# Patient Record
Sex: Female | Born: 1970 | Race: White | Hispanic: No | Marital: Married | Smoking: Never smoker
Health system: Southern US, Community
[De-identification: ages and names within clinical notes are randomized; demographics above are authoritative.]

## PROBLEM LIST (undated history)

## (undated) DIAGNOSIS — K219 Gastro-esophageal reflux disease without esophagitis: Secondary | ICD-10-CM

## (undated) DIAGNOSIS — F419 Anxiety disorder, unspecified: Secondary | ICD-10-CM

## (undated) DIAGNOSIS — G43109 Migraine with aura, not intractable, without status migrainosus: Secondary | ICD-10-CM

## (undated) HISTORY — DX: Gastro-esophageal reflux disease without esophagitis: K21.9

## (undated) HISTORY — DX: Anxiety disorder, unspecified: F41.9

## (undated) HISTORY — DX: Migraine with aura, not intractable, without status migrainosus: G43.109

---

## 2014-05-06 ENCOUNTER — Encounter: Payer: Self-pay | Admitting: Nurse Practitioner

## 2014-05-06 ENCOUNTER — Ambulatory Visit (INDEPENDENT_AMBULATORY_CARE_PROVIDER_SITE_OTHER): Payer: BC Managed Care – PPO | Admitting: Nurse Practitioner

## 2014-05-06 VITALS — BP 102/71 | HR 83 | Temp 98.2°F | Resp 18 | Ht 65.75 in | Wt 138.0 lb

## 2014-05-06 DIAGNOSIS — K219 Gastro-esophageal reflux disease without esophagitis: Secondary | ICD-10-CM

## 2014-05-06 DIAGNOSIS — Z0000 Encounter for general adult medical examination without abnormal findings: Secondary | ICD-10-CM

## 2014-05-06 DIAGNOSIS — Z23 Encounter for immunization: Secondary | ICD-10-CM

## 2014-05-06 DIAGNOSIS — F411 Generalized anxiety disorder: Secondary | ICD-10-CM

## 2014-05-06 NOTE — Patient Instructions (Signed)
Develop lifelong habits of exercise most days of the week: take a 30 minute walk. The benefits include weight loss, lower risk for heart disease, diabetes, stroke, high blood pressure, lower rates of depression & dementia, better sleep quality & bone health.  Start sinus rinses (Neilmed sinus rinse) & listerene gargles daily to clear up tonsils.   Start probiotic daily Align or Culterelle. Some studies show best outcomes with both types-take 1 capsule daily, but alternate brand each day.   Tonsillitis Tonsillitis is an infection of the throat that causes the tonsils to become red, tender, and swollen. Tonsils are collections of lymphoid tissue at the back of the throat. Each tonsil has crevices (crypts). Tonsils help fight nose and throat infections and keep infection from spreading to other parts of the body for the first 18 months of life.  CAUSES Sudden (acute) tonsillitis is usually caused by infection with streptococcal bacteria. Long-lasting (chronic) tonsillitis occurs when the crypts of the tonsils become filled with pieces of food and bacteria, which makes it easy for the tonsils to become repeatedly infected. SYMPTOMS  Symptoms of tonsillitis include:  A sore throat, with possible difficulty swallowing.  White patches on the tonsils.  Fever.  Tiredness.  New episodes of snoring during sleep, when you did not snore before.  Small, foul-smelling, yellowish-white pieces of material (tonsilloliths) that you occasionally cough up or spit out. The tonsilloliths can also cause you to have bad breath. DIAGNOSIS Tonsillitis can be diagnosed through a physical exam. Diagnosis can be confirmed with the results of lab tests, including a throat culture. TREATMENT  The goals of tonsillitis treatment include the reduction of the severity and duration of symptoms and prevention of associated conditions. Symptoms of tonsillitis can be improved with the use of steroids to reduce the swelling.  Tonsillitis caused by bacteria can be treated with antibiotic medicines. Usually, treatment with antibiotic medicines is started before the cause of the tonsillitis is known. However, if it is determined that the cause is not bacterial, antibiotic medicines will not treat the tonsillitis. If attacks of tonsillitis are severe and frequent, your health care provider may recommend surgery to remove the tonsils (tonsillectomy). HOME CARE INSTRUCTIONS   Rest as much as possible and get plenty of sleep.  Drink plenty of fluids. While the throat is very sore, eat soft foods or liquids, such as sherbet, soups, or instant breakfast drinks.  Eat frozen ice pops.  Gargle with a warm or cold liquid to help soothe the throat. Mix 1/4 teaspoon of salt and 1/4 teaspoon of baking soda in 8 oz of water. SEEK MEDICAL CARE IF:   Large, tender lumps develop in your neck.  A rash develops.  A green, yellow-brown, or bloody substance is coughed up.  You are unable to swallow liquids or food for 24 hours.  You notice that only one of the tonsils is swollen. SEEK IMMEDIATE MEDICAL CARE IF:   You develop any new symptoms such as vomiting, severe headache, stiff neck, chest pain, or trouble breathing or swallowing.  You have severe throat pain along with drooling or voice changes.  You have severe pain, unrelieved with recommended medications.  You are unable to fully open the mouth.  You develop redness, swelling, or severe pain anywhere in the neck.  You have a fever. MAKE SURE YOU:   Understand these instructions.  Will watch your condition.  Will get help right away if you are not doing well or get worse. Document Released: 05/11/2005 Document  Revised: 12/16/2013 Document Reviewed: 01/18/2013 [facility] Patient Information 2015 [place], [place]. This information is not intended to replace advice given to you by your health care provider. Make sure you discuss any questions you have with your health  care provider.   Preventive Care for Adults, Female A healthy lifestyle and preventive care can promote health and wellness. Preventive health guidelines for women include the following key practices.  A routine yearly physical is a good way to check with your caregiver about your health and preventive screening. It is a chance to share any concerns and updates on your health, and to receive a thorough exam.  Visit your dentist for a routine exam and preventive care every 6 months. Brush your teeth twice a day and floss once a day. Good oral hygiene prevents tooth decay and gum disease.  The frequency of eye exams is based on your age, health, family medical history, use of contact lenses, and other factors. Follow your caregiver's recommendations for frequency of eye exams.  Eat a healthy diet. Foods like vegetables, fruits, whole grains, low-fat dairy products, and lean protein foods contain the nutrients you need without too many calories. Decrease your intake of foods high in solid fats, added sugars, and salt. Eat the right amount of calories for you.Get information about a proper diet from your caregiver, if necessary.  Regular physical exercise is one of the most important things you can do for your health. Most adults should get at least 150 minutes of moderate-intensity exercise (any activity that increases your heart rate and causes you to sweat) each week. In addition, most adults need muscle-strengthening exercises on 2 or more days a week.  Maintain a healthy weight. The body mass index (BMI) is a screening tool to identify possible weight problems. It provides an estimate of body fat based on height and weight. Your caregiver can help determine your BMI, and can help you achieve or maintain a healthy weight.For adults 20 years and older:  A BMI below 18.5 is considered underweight.  A BMI of 18.5 to 24.9 is normal.  A BMI of 25 to 29.9 is considered overweight.  A BMI of 30  and above is considered obese.  Maintain normal blood lipids and cholesterol levels by exercising and minimizing your intake of saturated fat. Eat a balanced diet with plenty of fruit and vegetables. Blood tests for lipids and cholesterol should begin at age 40 and be repeated every 5 years. If your lipid or cholesterol levels are high, you are over 50, or you are at high risk for heart disease, you may need your cholesterol levels checked more frequently.Ongoing high lipid and cholesterol levels should be treated with medicines if diet and exercise are not effective.  If you smoke, find out from your caregiver how to quit. If you do not use tobacco, do not start.  Lung cancer screening is recommended for adults aged 24 80 years who are at high risk for developing lung cancer because of a history of smoking. Yearly low-dose computed tomography (CT) is recommended for people who have at least a 30-pack-year history of smoking and are a current smoker or have quit within the past 15 years. A pack year of smoking is smoking an average of 1 pack of cigarettes a day for 1 year (for example: 1 pack a day for 30 years or 2 packs a day for 15 years). Yearly screening should continue until the smoker has stopped smoking for at least 15 years. Yearly  screening should also be stopped for people who develop a health problem that would prevent them from having lung cancer treatment.  If you are pregnant, do not drink alcohol. If you are breastfeeding, be very cautious about drinking alcohol. If you are not pregnant and choose to drink alcohol, do not exceed 1 drink per day. One drink is considered to be 12 ounces (355 mL) of beer, 5 ounces (148 mL) of wine, or 1.5 ounces (44 mL) of liquor.  Avoid use of street drugs. Do not share needles with anyone. Ask for help if you need support or instructions about stopping the use of drugs.  High blood pressure causes heart disease and increases the risk of stroke. Your blood  pressure should be checked at least every 1 to 2 years. Ongoing high blood pressure should be treated with medicines if weight loss and exercise are not effective.  If you are 26 to 42 years old, ask your caregiver if you should take aspirin to prevent strokes.  Diabetes screening involves taking a blood sample to check your fasting blood sugar level. This should be done once every 3 years, after age 81, if you are within normal weight and without risk factors for diabetes. Testing should be considered at a younger age or be carried out more frequently if you are overweight and have at least 1 risk factor for diabetes.  Breast cancer screening is essential preventive care for women. You should practice "breast self-awareness." This means understanding the normal appearance and feel of your breasts and may include breast self-examination. Any changes detected, no matter how small, should be reported to a caregiver. Women in their 66s and 30s should have a clinical breast exam (CBE) by a caregiver as part of a regular health exam every 1 to 3 years. After age 52, women should have a CBE every year. Starting at age 39, women should consider having a mammography (breast X-ray test) every year. Women who have a family history of breast cancer should talk to their caregiver about genetic screening. Women at a high risk of breast cancer should talk to their caregivers about having magnetic resonance imaging (MRI) and a mammography every year.  Breast cancer gene (BRCA)-related cancer risk assessment is recommended for women who have family members with BRCA-related cancers. BRCA-related cancers include breast, ovarian, tubal, and peritoneal cancers. Having family members with these cancers may be associated with an increased risk for harmful changes (mutations) in the breast cancer genes BRCA1 and BRCA2. Results of the assessment will determine the need for genetic counseling and BRCA1 and BRCA2 testing.  The Pap  test is a screening test for cervical cancer. A Pap test can show cell changes on the cervix that might become cervical cancer if left untreated. A Pap test is a procedure in which cells are obtained and examined from the lower end of the uterus (cervix).  Women should have a Pap test starting at age 14.  Between ages 47 and 74, Pap tests should be repeated every 2 years.  Beginning at age 78, you should have a Pap test every 3 years as long as the past 3 Pap tests have been normal.  Some women have medical problems that increase the chance of getting cervical cancer. Talk to your caregiver about these problems. It is especially important to talk to your caregiver if a new problem develops soon after your last Pap test. In these cases, your caregiver may recommend more frequent screening and Pap tests.  The above recommendations are the same for women who have or have not gotten the vaccine for human papillomavirus (HPV).  If you had a hysterectomy for a problem that was not cancer or a condition that could lead to cancer, then you no longer need Pap tests. Even if you no longer need a Pap test, a regular exam is a good idea to make sure no other problems are starting.  If you are between ages 56 and 81, and you have had normal Pap tests going back 10 years, you no longer need Pap tests. Even if you no longer need a Pap test, a regular exam is a good idea to make sure no other problems are starting.  If you have had past treatment for cervical cancer or a condition that could lead to cancer, you need Pap tests and screening for cancer for at least 20 years after your treatment.  If Pap tests have been discontinued, risk factors (such as a new sexual partner) need to be reassessed to determine if screening should be resumed.  The HPV test is an additional test that may be used for cervical cancer screening. The HPV test looks for the virus that can cause the cell changes on the cervix. The cells  collected during the Pap test can be tested for HPV. The HPV test could be used to screen women aged 52 years and older, and should be used in women of any age who have unclear Pap test results. After the age of 47, women should have HPV testing at the same frequency as a Pap test.  Colorectal cancer can be detected and often prevented. Most routine colorectal cancer screening begins at the age of 56 and continues through age 57. However, your caregiver may recommend screening at an earlier age if you have risk factors for colon cancer. On a yearly basis, your caregiver may provide home test kits to check for hidden blood in the stool. Use of a small camera at the end of a tube, to directly examine the colon (sigmoidoscopy or colonoscopy), can detect the earliest forms of colorectal cancer. Talk to your caregiver about this at age 65, when routine screening begins. Direct examination of the colon should be repeated every 5 to 10 years through age 74, unless early forms of pre-cancerous polyps or small growths are found.  Hepatitis C blood testing is recommended for all people born from [birth year] through 1964 and any individual with known risks for hepatitis C.  Practice safe sex. Use condoms and avoid high-risk sexual practices to reduce the spread of sexually transmitted infections (STIs). STIs include gonorrhea, chlamydia, syphilis, trichomonas, herpes, HPV, and human immunodeficiency virus (HIV). Herpes, HIV, and HPV are viral illnesses that have no cure. They can result in disability, cancer, and death. Sexually active women aged 9 and younger should be checked for chlamydia. Older women with new or multiple partners should also be tested for chlamydia. Testing for other STIs is recommended if you are sexually active and at increased risk.  Osteoporosis is a disease in which the bones lose minerals and strength with aging. This can result in serious bone fractures. The risk of osteoporosis can be identified  using a bone density scan. Women ages 11 and over and women at risk for fractures or osteoporosis should discuss screening with their caregivers. Ask your caregiver whether you should take a calcium supplement or vitamin D to reduce the rate of osteoporosis.  Menopause can be associated with physical symptoms  and risks. Hormone replacement therapy is available to decrease symptoms and risks. You should talk to your caregiver about whether hormone replacement therapy is right for you.  Use sunscreen. Apply sunscreen liberally and repeatedly throughout the day. You should seek shade when your shadow is shorter than you. Protect yourself by wearing long sleeves, pants, a wide-brimmed hat, and sunglasses year round, whenever you are outdoors.  Once a month, do a whole body skin exam, using a mirror to look at the skin on your back. Notify your caregiver of new moles, moles that have irregular borders, moles that are larger than a pencil eraser, or moles that have changed in shape or color.  Stay current with required immunizations.  Influenza vaccine. All adults should be immunized every year.  Tetanus, diphtheria, and acellular pertussis (Td, Tdap) vaccine. Pregnant women should receive 1 dose of Tdap vaccine during each pregnancy. The dose should be obtained regardless of the length of time since the last dose. Immunization is preferred during the 27th to 36th week of gestation. An adult who has not previously received Tdap or who does not know her vaccine status should receive 1 dose of Tdap. This initial dose should be followed by tetanus and diphtheria toxoids (Td) booster doses every 10 years. Adults with an unknown or incomplete history of completing a 3-dose immunization series with Td-containing vaccines should begin or complete a primary immunization series including a Tdap dose. Adults should receive a Td booster every 10 years.  Varicella vaccine. An adult without evidence of immunity to  varicella should receive 2 doses or a second dose if she has previously received 1 dose. Pregnant females who do not have evidence of immunity should receive the first dose after pregnancy. This first dose should be obtained before leaving the health care facility. The second dose should be obtained 4 8 weeks after the first dose.  Human papillomavirus (HPV) vaccine. Females aged 90 26 years who have not received the vaccine previously should obtain the 3-dose series. The vaccine is not recommended for use in pregnant females. However, pregnancy testing is not needed before receiving a dose. If a female is found to be pregnant after receiving a dose, no treatment is needed. In that case, the remaining doses should be delayed until after the pregnancy. Immunization is recommended for any person with an immunocompromised condition through the age of 28 years if she did not get any or all doses earlier. During the 3-dose series, the second dose should be obtained 4 8 weeks after the first dose. The third dose should be obtained 24 weeks after the first dose and 16 weeks after the second dose.  Zoster vaccine. One dose is recommended for adults aged 28 years or older unless certain conditions are present.  Measles, mumps, and rubella (MMR) vaccine. Adults born before [birth year] generally are considered immune to measles and mumps. Adults born in [birth year] or later should have 1 or more doses of MMR vaccine unless there is a contraindication to the vaccine or there is laboratory evidence of immunity to each of the three diseases. A routine second dose of MMR vaccine should be obtained at least 28 days after the first dose for students attending postsecondary schools, health care workers, or international travelers. People who received inactivated measles vaccine or an unknown type of measles vaccine during 1963 1967 should receive 2 doses of MMR vaccine. People who received inactivated mumps vaccine or an unknown type of  mumps vaccine before 1979 and are  at high risk for mumps infection should consider immunization with 2 doses of MMR vaccine. For females of childbearing age, rubella immunity should be determined. If there is no evidence of immunity, females who are not pregnant should be vaccinated. If there is no evidence of immunity, females who are pregnant should delay immunization until after pregnancy. Unvaccinated health care workers born before 75 who lack laboratory evidence of measles, mumps, or rubella immunity or laboratory confirmation of disease should consider measles and mumps immunization with 2 doses of MMR vaccine or rubella immunization with 1 dose of MMR vaccine.  Pneumococcal 13-valent conjugate (PCV13) vaccine. When indicated, a person who is uncertain of her immunization history and has no record of immunization should receive the PCV13 vaccine. An adult aged 83 years or older who has certain medical conditions and has not been previously immunized should receive 1 dose of PCV13 vaccine. This PCV13 should be followed with a dose of pneumococcal polysaccharide (PPSV23) vaccine. The PPSV23 vaccine dose should be obtained at least 8 weeks after the dose of PCV13 vaccine. An adult aged 52 years or older who has certain medical conditions and previously received 1 or more doses of PPSV23 vaccine should receive 1 dose of PCV13. The PCV13 vaccine dose should be obtained 1 or more years after the last PPSV23 vaccine dose.  Pneumococcal polysaccharide (PPSV23) vaccine. When PCV13 is also indicated, PCV13 should be obtained first. All adults aged 57 years and older should be immunized. An adult younger than age 76 years who has certain medical conditions should be immunized. Any person who resides in a nursing home or long-term care facility should be immunized. An adult smoker should be immunized. People with an immunocompromised condition and certain other conditions should receive both PCV13 and PPSV23  vaccines. People with human immunodeficiency virus (HIV) infection should be immunized as soon as possible after diagnosis. Immunization during chemotherapy or radiation therapy should be avoided. Routine use of PPSV23 vaccine is not recommended for American Indians, [place] Natives, or people younger than 65 years unless there are medical conditions that require PPSV23 vaccine. When indicated, people who have unknown immunization and have no record of immunization should receive PPSV23 vaccine. One-time revaccination 5 years after the first dose of PPSV23 is recommended for people aged 10 64 years who have chronic kidney failure, nephrotic syndrome, asplenia, or immunocompromised conditions. People who received 1 2 doses of PPSV23 before age 6 years should receive another dose of PPSV23 vaccine at age 35 years or later if at least 5 years have passed since the previous dose. Doses of PPSV23 are not needed for people immunized with PPSV23 at or after age 91 years.  Meningococcal vaccine. Adults with asplenia or persistent complement component deficiencies should receive 2 doses of quadrivalent meningococcal conjugate (MenACWY-D) vaccine. The doses should be obtained at least 2 months apart. Microbiologists working with certain meningococcal bacteria, [employer] recruits, people at risk during an outbreak, and people who travel to or live in countries with a high rate of meningitis should be immunized. A first-year college student up through age 35 years who is living in a residence hall should receive a dose if she did not receive a dose on or after her 16th birthday. Adults who have certain high-risk conditions should receive one or more doses of vaccine.  Hepatitis A vaccine. Adults who wish to be protected from this disease, have certain high-risk conditions, work with hepatitis A-infected animals, work in hepatitis A research labs, or travel to or  work in countries with a high rate of hepatitis A should be  immunized. Adults who were previously unvaccinated and who anticipate close contact with an international adoptee during the first 60 days after arrival in the [place] from a country with a high rate of hepatitis A should be immunized.  Hepatitis B vaccine. Adults who wish to be protected from this disease, have certain high-risk conditions, may be exposed to blood or other infectious body fluids, are household contacts or sex partners of hepatitis B positive people, are clients or workers in certain care facilities, or travel to or work in countries with a high rate of hepatitis B should be immunized.  Haemophilus influenzae type b (Hib) vaccine. A previously unvaccinated person with asplenia or sickle cell disease or having a scheduled splenectomy should receive 1 dose of Hib vaccine. Regardless of previous immunization, a recipient of a hematopoietic stem cell transplant should receive a 3-dose series 6 12 months after her successful transplant. Hib vaccine is not recommended for adults with HIV infection. Preventive Services / Frequency Ages 71 to 75  Blood pressure check.** / Every 1 to 2 years.  Lipid and cholesterol check.** / Every 5 years beginning at age 52.  Clinical breast exam.** / Every 3 years for women in their 31s and 31s.  BRCA-related cancer risk assessment.** / For women who have family members with a BRCA-related cancer (breast, ovarian, tubal, or peritoneal cancers).  Pap test.** / Every 2 years from ages 28 through 48. Every 3 years starting at age 17 through age 55 or 41 with a history of 3 consecutive normal Pap tests.  HPV screening.** / Every 3 years from ages 76 through ages 56 to 80 with a history of 3 consecutive normal Pap tests.  Hepatitis C blood test.** / For any individual with known risks for hepatitis C.  Skin self-exam. / Monthly.  Influenza vaccine. / Every year.  Tetanus, diphtheria, and acellular pertussis (Tdap, Td) vaccine.** / Consult your  caregiver. Pregnant women should receive 1 dose of Tdap vaccine during each pregnancy. 1 dose of Td every 10 years.  Varicella vaccine.** / Consult your caregiver. Pregnant females who do not have evidence of immunity should receive the first dose after pregnancy.  HPV vaccine. / 3 doses over 6 months, if 4 and younger. The vaccine is not recommended for use in pregnant females. However, pregnancy testing is not needed before receiving a dose.  Measles, mumps, rubella (MMR) vaccine.** / You need at least 1 dose of MMR if you were born in 1957 or later. You may also need a 2nd dose. For females of childbearing age, rubella immunity should be determined. If there is no evidence of immunity, females who are not pregnant should be vaccinated. If there is no evidence of immunity, females who are pregnant should delay immunization until after pregnancy.  Pneumococcal 13-valent conjugate (PCV13) vaccine.** / Consult your caregiver.  Pneumococcal polysaccharide (PPSV23) vaccine.** / 1 to 2 doses if you smoke cigarettes or if you have certain conditions.  Meningococcal vaccine.** / 1 dose if you are age 60 to 74 years and a Market researcher living in a residence hall, or have one of several medical conditions, you need to get vaccinated against meningococcal disease. You may also need additional booster doses.  Hepatitis A vaccine.** / Consult your caregiver.  Hepatitis B vaccine.** / Consult your caregiver.  Haemophilus influenzae type b (Hib) vaccine.** / Consult your caregiver. Ages 59 to 78  Blood pressure  check.** / Every 1 to 2 years.  Lipid and cholesterol check.** / Every 5 years beginning at age 51.  Lung cancer screening. / Every year if you are aged 60 80 years and have a 30-pack-year history of smoking and currently smoke or have quit within the past 15 years. Yearly screening is stopped once you have quit smoking for at least 15 years or develop a health problem that would  prevent you from having lung cancer treatment.  Clinical breast exam.** / Every year after age 49.  BRCA-related cancer risk assessment.** / For women who have family members with a BRCA-related cancer (breast, ovarian, tubal, or peritoneal cancers).  Mammogram.** / Every year beginning at age 59 and continuing for as long as you are in good health. Consult with your caregiver.  Pap test.** / Every 3 years starting at age 47 through age 54 or 71 with a history of 3 consecutive normal Pap tests.  HPV screening.** / Every 3 years from ages 17 through ages 30 to 46 with a history of 3 consecutive normal Pap tests.  Fecal occult blood test (FOBT) of stool. / Every year beginning at age 21 and continuing until age 4. You may not need to do this test if you get a colonoscopy every 10 years.  Flexible sigmoidoscopy or colonoscopy.** / Every 5 years for a flexible sigmoidoscopy or every 10 years for a colonoscopy beginning at age 83 and continuing until age 56.  Hepatitis C blood test.** / For all people born from [birth year] through 1965 and any individual with known risks for hepatitis C.  Skin self-exam. / Monthly.  Influenza vaccine. / Every year.  Tetanus, diphtheria, and acellular pertussis (Tdap/Td) vaccine.** / Consult your caregiver. Pregnant women should receive 1 dose of Tdap vaccine during each pregnancy. 1 dose of Td every 10 years.  Varicella vaccine.** / Consult your caregiver. Pregnant females who do not have evidence of immunity should receive the first dose after pregnancy.  Zoster vaccine.** / 1 dose for adults aged 71 years or older.  Measles, mumps, rubella (MMR) vaccine.** / You need at least 1 dose of MMR if you were born in 1957 or later. You may also need a 2nd dose. For females of childbearing age, rubella immunity should be determined. If there is no evidence of immunity, females who are not pregnant should be vaccinated. If there is no evidence of immunity, females who are  pregnant should delay immunization until after pregnancy.  Pneumococcal 13-valent conjugate (PCV13) vaccine.** / Consult your caregiver.  Pneumococcal polysaccharide (PPSV23) vaccine.** / 1 to 2 doses if you smoke cigarettes or if you have certain conditions.  Meningococcal vaccine.** / Consult your caregiver.  Hepatitis A vaccine.** / Consult your caregiver.  Hepatitis B vaccine.** / Consult your caregiver.  Haemophilus influenzae type b (Hib) vaccine.** / Consult your caregiver. Ages 36 and over  Blood pressure check.** / Every 1 to 2 years.  Lipid and cholesterol check.** / Every 5 years beginning at age 40.  Lung cancer screening. / Every year if you are aged 65 80 years and have a 30-pack-year history of smoking and currently smoke or have quit within the past 15 years. Yearly screening is stopped once you have quit smoking for at least 15 years or develop a health problem that would prevent you from having lung cancer treatment.  Clinical breast exam.** / Every year after age 43.  BRCA-related cancer risk assessment.** / For women who have family members with a  BRCA-related cancer (breast, ovarian, tubal, or peritoneal cancers).  Mammogram.** / Every year beginning at age 54 and continuing for as long as you are in good health. Consult with your caregiver.  Pap test.** / Every 3 years starting at age 76 through age 5 or 35 with a 3 consecutive normal Pap tests. Testing can be stopped between 65 and 70 with 3 consecutive normal Pap tests and no abnormal Pap or HPV tests in the past 10 years.  HPV screening.** / Every 3 years from ages 52 through ages 62 or 40 with a history of 3 consecutive normal Pap tests. Testing can be stopped between 65 and 70 with 3 consecutive normal Pap tests and no abnormal Pap or HPV tests in the past 10 years.  Fecal occult blood test (FOBT) of stool. / Every year beginning at age 65 and continuing until age 45. You may not need to do this test if you  get a colonoscopy every 10 years.  Flexible sigmoidoscopy or colonoscopy.** / Every 5 years for a flexible sigmoidoscopy or every 10 years for a colonoscopy beginning at age 53 and continuing until age 34.  Hepatitis C blood test.** / For all people born from [birth year] through 1965 and any individual with known risks for hepatitis C.  Osteoporosis screening.** / A one-time screening for women ages 68 and over and women at risk for fractures or osteoporosis.  Skin self-exam. / Monthly.  Influenza vaccine. / Every year.  Tetanus, diphtheria, and acellular pertussis (Tdap/Td) vaccine.** / 1 dose of Td every 10 years.  Varicella vaccine.** / Consult your caregiver.  Zoster vaccine.** / 1 dose for adults aged 71 years or older.  Pneumococcal 13-valent conjugate (PCV13) vaccine.** / Consult your caregiver.  Pneumococcal polysaccharide (PPSV23) vaccine.** / 1 dose for all adults aged 57 years and older.  Meningococcal vaccine.** / Consult your caregiver.  Hepatitis A vaccine.** / Consult your caregiver.  Hepatitis B vaccine.** / Consult your caregiver.  Haemophilus influenzae type b (Hib) vaccine.** / Consult your caregiver. ** Family history and personal history of risk and conditions may change your caregiver's recommendations. Document Released: 09/27/2001 Document Revised: 11/26/2012 Document Reviewed: 12/27/2010 [facility] Patient Information 2014 [place], [place].

## 2014-05-06 NOTE — Progress Notes (Signed)
Pre visit review using our clinic review tool, if applicable. No additional management support is needed unless otherwise documented below in the visit note. 

## 2014-05-06 NOTE — Progress Notes (Signed)
Subjective:     Natalie Pitts is a 43 y.o. female and is here to establish care. She recently relocated from  [place] [facility] [place]. She had PCP there. Thinks vaccines & screening UTP(Pap this yr, MMG last yr-has appt pending next mo. At [facility] She takes tums/rolaids 2-3 times weekly for GERD. She took prilosec in past-caused stomach ache. She is aware of food triggers.  She has a prescription for ativan .5 mg for anxiety: she describes episodes of panicky feeling when wakes with nausea & heartburn. She has had a few episodes during day. Ativan works well. She took celexa in past for about 10 days-she did not like the way it made her feel. The patient reports problems - dry cough that started 3 mos ago. She took 2 different antibiotics with improvement, but is concerned that she still has occasional cough. She denies fever, fatigue, nasal congestion, chest pain. Also, she mentions feeling lightheaded during mc ON MOS THAT are heavy. MC are coming closer together & sometimes very heavy. She had labs with PCP in [place] a few mos ago-she thinks WBC were low, but PCP was not concerned. I will request records.  History   Social History  . Marital Status: Married    Spouse Name: N/A    Number of Children: 4  . Years of Education: N/A   Occupational History  .      homemaker   Social History Main Topics  . Smoking status: Never Smoker   . Smokeless tobacco: Not on file  . Alcohol Use: No  . Drug Use: No  . Sexual Activity: Yes   Other Topics Concern  . Not on file   Social History Narrative   Ms. Hunsberger lives with her husband & two younger children. @ older children live in [place]. She relocated to [facility] recently. She is a Clinical biochemist.   There are no preventive care reminders to display for this patient.  The following portions of the patient's history were reviewed and updated as appropriate: allergies, current medications, past family history, past medical history, past social history,  past surgical history and problem list.  Review of Systems Pertinent items are noted in HPI.   Objective:    BP 102/71  Pulse 83  Temp(Src) 98.2 F (36.8 C) (Oral)  Resp 18  Ht 5' 5.75" (1.67 m)  Wt 138 lb (62.596 kg)  BMI 22.44 kg/m2  SpO2 99%  LMP 04/29/2014 General appearance: alert, cooperative, appears stated age and no distress Head: Normocephalic, without obvious abnormality, atraumatic Eyes: negative findings: lids and lashes normal, conjunctivae and sclerae normal, corneas clear and pupils equal, round, reactive to light and accomodation Ears: R TM nml, canal nml. L canal filled with cerumen, unable to visualize TM. Throat: lips, mucosa, and tongue normal; teeth and gums normal and cryptic tonsils w/small amt exudate. +2 bilat. Lungs: clear to auscultation bilaterally Heart: regular rate and rhythm, S1, S2 normal, no murmur, click, rub or gallop Abdomen: soft, non-tender; bowel sounds normal; no masses,  no organomegaly Extremities: extremities normal, atraumatic, no cyanosis or edema Pulses: 2+ and symmetric Lymph nodes: Cervical, supraclavicular, and axillary nodes normal. Neurologic: Grossly normal    Assessment:  1. Gastroesophageal reflux disease, esophagitis presence not specified Start probiotics  2. Anxiety state, unspecified Continue Ativan PRN  3. preventive care MMg at [facility] old records Flu today

## 2014-05-22 ENCOUNTER — Telehealth: Payer: Self-pay | Admitting: Nurse Practitioner

## 2014-05-22 DIAGNOSIS — D709 Neutropenia, unspecified: Secondary | ICD-10-CM

## 2014-05-22 DIAGNOSIS — R5383 Other fatigue: Secondary | ICD-10-CM

## 2014-05-22 NOTE — Telephone Encounter (Signed)
Patient has been feeling tired lately. She has had lab results from her prior physician faxed to our office for review. She would like to know what Natalie Pitts thinks about those results & whether they indicate why she feels tired.

## 2014-05-22 NOTE — Telephone Encounter (Signed)
Results are on top of tray on your desk. Patient aware that she will not get cb for a few days.

## 2014-05-23 NOTE — Telephone Encounter (Signed)
Reviewed historical labs dated 01/17/14. Iron low norm, WBC slightly low 4.1, B12 borderline 409, Vit D low 32.  Will repeat CBC & iron panel.  Recmd Vit D3 daily.

## 2014-05-23 NOTE — Telephone Encounter (Signed)
Patient notified of results. Patient expressed understanding. Pt scheduled lab appt.

## 2014-05-29 ENCOUNTER — Telehealth (HOSPITAL_COMMUNITY): Payer: Self-pay | Admitting: *Deleted

## 2014-05-29 ENCOUNTER — Emergency Department (INDEPENDENT_AMBULATORY_CARE_PROVIDER_SITE_OTHER)
Admission: EM | Admit: 2014-05-29 | Discharge: 2014-05-29 | Disposition: A | Discharge status: Home / Self Care | Payer: BC Managed Care – PPO | Source: Home / Self Care | Attending: Family Medicine | Admitting: Family Medicine

## 2014-05-29 ENCOUNTER — Encounter (HOSPITAL_COMMUNITY): Payer: Self-pay | Admitting: Family Medicine

## 2014-05-29 DIAGNOSIS — M7122 Synovial cyst of popliteal space [Baker], left knee: Secondary | ICD-10-CM

## 2014-05-29 DIAGNOSIS — G5622 Lesion of ulnar nerve, left upper limb: Secondary | ICD-10-CM

## 2014-05-29 LAB — CBC WITH DIFFERENTIAL/PLATELET
BASOS ABS: 0 10*3/uL (ref 0.0–0.1)
Basophils Relative: 0 % (ref 0–1)
EOS PCT: 1 % (ref 0–5)
Eosinophils Absolute: 0.1 10*3/uL (ref 0.0–0.7)
HCT: 39 % (ref 36.0–46.0)
Hemoglobin: 12.8 g/dL (ref 12.0–15.0)
Lymphocytes Relative: 34 % (ref 12–46)
Lymphs Abs: 2.1 10*3/uL (ref 0.7–4.0)
MCH: 27.2 pg (ref 26.0–34.0)
MCHC: 32.8 g/dL (ref 30.0–36.0)
MCV: 82.8 fL (ref 78.0–100.0)
Monocytes Absolute: 0.4 10*3/uL (ref 0.1–1.0)
Monocytes Relative: 6 % (ref 3–12)
NEUTROS ABS: 3.6 10*3/uL (ref 1.7–7.7)
Neutrophils Relative %: 59 % (ref 43–77)
Platelets: 244 10*3/uL (ref 150–400)
RBC: 4.71 MIL/uL (ref 3.87–5.11)
RDW: 13.4 % (ref 11.5–15.5)
WBC: 6.3 10*3/uL (ref 4.0–10.5)

## 2014-05-29 LAB — D-DIMER, QUANTITATIVE (NOT AT ARMC): D-Dimer, Quant: 0.4 ug/mL-FEU (ref 0.00–0.48)

## 2014-05-29 NOTE — ED Notes (Signed)
Pain behind left knee that started several days ago, pain is intermittent, but associated with walking.  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  Family moved to the area in august 2015

## 2014-05-29 NOTE — ED Provider Notes (Signed)
Limited ultrasound of the left posterior knee: Baker's cyst visible. Approximately 2-3cm  Lynne Leader, MD  Gregor Hams, MD 05/29/14 [phone]

## 2014-05-29 NOTE — Discharge Instructions (Signed)
The pain in your left leg may be from a small baker's cyst, musculoskeletal pain, or a clot. THe ultrasound performed in our clinic today showed a small cyst. These are not dangerous to your health but can continue cause pain and discomfort if they increase in size. Consider making a follow-up appointment with Dr. Dianah Field in [place] [phone]).  We will call you if your other results are concerning for a blood clot and if you need an ultrasound.  Please aply heat and stretch the area and take ibuprofen 400-600mg  every 6 hours for relief. Please call with any further questions.    Baker Cyst A Baker cyst is a sac-like structure that forms in the back of the knee. It is filled with the same fluid that is located in your knee. This fluid lubricates the bones and cartilage of the knee and allows them to move over each other more easily. CAUSES  When the knee becomes injured or inflamed, increased fluid forms in the knee. When this happens, the joint lining is pushed out behind the knee and forms the Baker cyst. This cyst may also be caused by inflammation from arthritic conditions and infections. SIGNS AND SYMPTOMS  A Baker cyst usually has no symptoms. When the cyst is substantially enlarged:  You may feel pressure behind the knee, stiffness in the knee, or a mass in the area behind the knee.  You may develop pain, redness, and swelling in the calf. This can suggest a blood clot and requires evaluation by your health care provider. DIAGNOSIS  A Baker cyst is most often found during an ultrasound exam. This exam may have been performed for other reasons, and the cyst was found incidentally. Sometimes an MRI is used. This picks up other problems within a joint that an ultrasound exam may not. If the Baker cyst developed immediately after an injury, X-ray exams may be used to diagnose the cyst. TREATMENT  The treatment depends on the cause of the cyst. Anti-inflammatory medicines and  rest often will be prescribed. If the cyst is caused by a bacterial infection, antibiotic medicines may be prescribed.  HOME CARE INSTRUCTIONS   If the cyst was caused by an injury, for the first 24 hours, keep the injured leg elevated on 2 pillows while lying down.  For the first 24 hours while you are awake, apply ice to the injured area:  Put ice in a plastic bag.  Place a towel between your skin and the bag.  Leave the ice on for 20 minutes, 2-3 times a day.  Only take over-the-counter or prescription medicines for pain, discomfort, or fever as directed by your health care provider.  Only take antibiotic medicine as directed. Make sure to finish it even if you start to feel better. MAKE SURE YOU:   Understand these instructions.  Will watch your condition.  Will get help right away if you are not doing well or get worse. Document Released: 08/01/2005 Document Revised: 05/22/2013 Document Reviewed: 03/13/2013 [facility] Patient Information 2015 [place], [place]. This information is not intended to replace advice given to you by your health care provider. Make sure you discuss any questions you have with your health care provider.

## 2014-05-29 NOTE — ED Provider Notes (Addendum)
CSN: [id number]     Arrival date & time 05/29/14  1007 History   First MD Initiated Contact with Patient 05/29/14 1017     Chief Complaint  Patient presents with  . Leg Pain   (Consider location/radiation/quality/duration/timing/severity/associated sxs/prior Treatment) HPI  L posterior leg pain. No change. Feels like a tight, crampy pain. Started 4 days ago. Contstant Worse w/ ambulation. Has not tried any medications for the pain. No change in exercise routine. No tripping or foot drop on L. Denies back pain, one sided weakness, swelling, CP, SOB.   Long car trip in August.   Past Medical History  Diagnosis Date  . Migraine with aura   . GERD (gastroesophageal reflux disease)   . Anxiety    History reviewed. No pertinent past surgical history. Family History  Problem Relation Age of Onset  . Diabetes Mother   . Hyperlipidemia Mother   . Hypertension Mother   . Hypertension Father   . Cancer Father     prostate  . Endometriosis Sister   . Alcohol abuse Brother   . Diabetes Maternal Aunt   . Diabetes Brother    History  Substance Use Topics  . Smoking status: Never Smoker   . Smokeless tobacco: Not on file  . Alcohol Use: No   OB History   Grav Para Term Preterm Abortions TAB SAB Ect Mult Living                 Review of Systems Per HPI with all other pertinent systems negative.   Allergies  Penicillins  Home Medications   Prior to Admission medications   Medication Sig Start Date End Date Taking? Authorizing Provider  LORazepam (ATIVAN) 0.5 MG tablet Take 0.5 mg by mouth every 8 (eight) hours.    Historical Provider, MD   BP 131/87  Pulse 81  Temp(Src) 98.3 F (36.8 C) (Oral)  Resp 14  SpO2 98%  LMP 05/22/2014 Physical Exam  Constitutional: She is oriented to person, place, and time. She appears well-developed and well-nourished. No distress.  HENT:  Head: Normocephalic and atraumatic.  Eyes: EOM are normal. Pupils are equal, round, and reactive to  light.  Neck: Normal range of motion.  Cardiovascular: Normal rate.   Pulmonary/Chest: Effort normal. No respiratory distress.  Abdominal: Soft.  Musculoskeletal:  L leg 38cm at the level of the calf and R leg 37cm at the level of the calf. Fullness of the L popliteal fossa FROM   Neurological: She is alert and oriented to person, place, and time.  Skin: Skin is warm. She is not diaphoretic.  Psychiatric: She has a normal mood and affect. Her behavior is normal. Judgment and thought content normal.    ED Course  Procedures (including critical care time) Labs Review Labs Reviewed  D-DIMER, QUANTITATIVE  CBC WITH DIFFERENTIAL    Imaging Review No results found.   MDM   1. Baker's cyst, unruptured, left   [place] confirmed baker's cyst on L (performed by myself and Dr. Lynne Leader) F/u w/ Dr. Darene Lamer at [place] for further [facility] of baker's cyst NSAIDs, compression, heat for MSK type pain Ddimer sent - will have pt obtain US if + (r/o PE)  Precautions given and all questions answered  Linna Darner, MD Family Medicine 05/29/2014, 11:19 AM      Waldemar Dickens, MD 05/29/14 1119    ------------------------------------ Pt called and notified of negative blood results for DVT. No further workup needed  Waldemar Dickens, MD 05/29/14  1206 

## 2014-05-29 NOTE — ED Notes (Signed)
Pt. Called for her lab results.  Pt. verified x 2 and given normal results.( D-dimer <0.40, CBC w/diff normal). Roselyn Meier 05/29/2014

## 2014-06-04 ENCOUNTER — Other Ambulatory Visit: Payer: BC Managed Care – PPO

## 2014-06-10 ENCOUNTER — Other Ambulatory Visit: Payer: BC Managed Care – PPO

## 2014-06-12 ENCOUNTER — Other Ambulatory Visit: Payer: BC Managed Care – PPO

## 2014-06-17 ENCOUNTER — Encounter: Payer: Self-pay | Admitting: Nurse Practitioner

## 2014-06-17 DIAGNOSIS — E041 Nontoxic single thyroid nodule: Secondary | ICD-10-CM | POA: Insufficient documentation

## 2014-06-17 DIAGNOSIS — E559 Vitamin D deficiency, unspecified: Secondary | ICD-10-CM | POA: Insufficient documentation

## 2014-06-17 DIAGNOSIS — D259 Leiomyoma of uterus, unspecified: Secondary | ICD-10-CM | POA: Insufficient documentation

## 2014-06-23 ENCOUNTER — Encounter: Payer: Self-pay | Admitting: Nurse Practitioner

## 2014-06-26 ENCOUNTER — Ambulatory Visit (INDEPENDENT_AMBULATORY_CARE_PROVIDER_SITE_OTHER): Payer: BC Managed Care – PPO

## 2014-06-26 ENCOUNTER — Encounter: Payer: Self-pay | Admitting: Sports Medicine

## 2014-06-26 ENCOUNTER — Ambulatory Visit (INDEPENDENT_AMBULATORY_CARE_PROVIDER_SITE_OTHER): Payer: BC Managed Care – PPO | Admitting: Sports Medicine

## 2014-06-26 VITALS — BP 105/75 | HR 88 | Ht 66.0 in | Wt 138.0 lb

## 2014-06-26 DIAGNOSIS — Z1389 Encounter for screening for other disorder: Secondary | ICD-10-CM

## 2014-06-26 DIAGNOSIS — M25562 Pain in left knee: Secondary | ICD-10-CM | POA: Diagnosis not present

## 2014-06-26 MED ORDER — MELOXICAM 15 MG PO TABS
ORAL_TABLET | ORAL | Status: DC
Start: 2014-06-26 — End: 2014-06-26

## 2014-06-26 MED ORDER — VITAMIN D (ERGOCALCIFEROL) 1.25 MG (50000 UNIT) PO CAPS: 50000.0000 [IU] | ORAL_CAPSULE | ORAL | Status: DC

## 2014-06-26 MED ORDER — MELOXICAM 15 MG PO TABS: ORAL_TABLET | ORAL | Status: DC

## 2014-06-26 NOTE — Progress Notes (Signed)
   Subjective:    I'm seeing this patient as a consultation for:  Dr. Lynne Leader, Dr. Linna Darner  CC: left knee pain  HPI: This is a pleasant 43 year old female, she is healthy and in good shape. For the past several months she's noted increasing pain that she localizes on the posterior midline of her knee. She denies any trauma denies any swelling, and denies any mechanical symptoms. At one point it became bad enough for her to visit the urgent care center in [place]. An ultrasound at the time showed a small Baker's cyst. She was referred to me for further evaluation and definitive treatment. She tells me her pain is mild to moderate, but she has not done any rehabilitation exercises or use any NSAIDs.  Past medical history, Surgical history, Family history not pertinant except as noted below, Social history, Allergies, and medications have been entered into the medical record, reviewed, and no changes needed.   Review of Systems: No headache, visual changes, nausea, vomiting, diarrhea, constipation, dizziness, abdominal pain, skin rash, fevers, chills, night sweats, weight loss, swollen lymph nodes, body aches, joint swelling, muscle aches, chest pain, shortness of breath, mood changes, visual or auditory hallucinations.   Objective:   General: Well Developed, well nourished, and in no acute distress.  Neuro/Psych: Alert and oriented x3, extra-ocular muscles intact, able to move all 4 extremities, sensation grossly intact. Skin: Warm and dry, no rashes noted.  Respiratory: Not using accessory muscles, speaking in full sentences, trachea midline.  Cardiovascular: Pulses palpable, no extremity edema. Abdomen: Does not appear distended. Left Knee: Normal to inspection with no erythema or effusion or obvious bony abnormalities. Palpation normal with no warmth or joint line tenderness or patellar tenderness or condyle tenderness. ROM normal in flexion and extension and lower leg  rotation. Ligaments with solid consistent endpoints including ACL, PCL, LCL, MCL. Negative Mcmurray's and provocative meniscal tests. Non painful patellar compression. Patellar and quadriceps tendons unremarkable. Hamstring and quadriceps strength is normal. There is a slight fullness with mild tenderness in the posterior midline however I am unable to feel the Baker's cyst between the gastrocnemius and semimembranosus.  Procedure: Diagnostic Ultrasound of  Left knee, posterior aspect Device: GE Logiq E  Findings: Noted a small Baker's cyst protruding at the crux of the semimembranosus and gastrocnemius medial head. There is also a small amount of osteophytosis at the posterior aspect of the femoral head. There was no sonographic Murphy's type sign over the Baker's cyst. Images permanently stored and available for review in the ultrasound unit.  Impression: small Baker's cyst, unruptured, with mild knee osteoarthritis.  Impression and Recommendations:   This case required medical decision making of moderate complexity.

## 2014-06-26 NOTE — Assessment & Plan Note (Signed)
Concern for Baker's cyst on ultrasound at urgent care. On my evaluation the Baker's cyst is very small, her pain is more lateral than the gastrocnemius/semimembranosus crux. We are going to start conservatively with Mobic, formal physical therapy, x-rays. I did see a touch of osteoarthritis on the ultrasound in the posterior femoral condyle. Return to see me in one month.

## 2014-07-04 ENCOUNTER — Ambulatory Visit: Payer: BC Managed Care – PPO | Admitting: Physical Therapy

## 2014-07-15 ENCOUNTER — Ambulatory Visit: Payer: BC Managed Care – PPO | Admitting: Physical Therapy

## 2014-07-24 ENCOUNTER — Ambulatory Visit: Payer: BC Managed Care – PPO | Admitting: Family Medicine

## 2014-08-26 ENCOUNTER — Telehealth: Payer: Self-pay | Admitting: *Deleted

## 2014-08-26 NOTE — Telephone Encounter (Signed)
I do not know any GI docs in [place]. Please kindly let pt know that providers in [place] are part of [place] we are [place]. She can certainly see providers in both systems, but she may have more seamless care if she stays in one system. She should check w/providers that are covered by her ins plan.

## 2014-08-26 NOTE — Telephone Encounter (Signed)
Patient called office and spoke to Nelson, requesting a recommendation for a gastrologist in [place]. Please advise?

## 2014-08-27 NOTE — Telephone Encounter (Signed)
Patient notified

## 2014-09-03 ENCOUNTER — Ambulatory Visit (HOSPITAL_COMMUNITY): Payer: Self-pay | Admitting: Licensed Clinical Social Worker

## 2014-09-16 ENCOUNTER — Encounter: Payer: Self-pay | Admitting: Nurse Practitioner

## 2014-09-16 ENCOUNTER — Other Ambulatory Visit: Payer: Self-pay | Admitting: *Deleted

## 2014-09-16 ENCOUNTER — Ambulatory Visit (INDEPENDENT_AMBULATORY_CARE_PROVIDER_SITE_OTHER): Payer: BLUE CROSS/BLUE SHIELD | Admitting: Nurse Practitioner

## 2014-09-16 VITALS — BP 95/67 | HR 91 | Temp 98.0°F | Ht 65.75 in | Wt 135.0 lb

## 2014-09-16 DIAGNOSIS — K2970 Gastritis, unspecified, without bleeding: Secondary | ICD-10-CM

## 2014-09-16 DIAGNOSIS — F411 Generalized anxiety disorder: Secondary | ICD-10-CM

## 2014-09-16 MED ORDER — LORAZEPAM 0.5 MG PO TABS: 0.5000 mg | ORAL_TABLET | Freq: Every day | ORAL | Status: DC | PRN

## 2014-09-16 MED ORDER — FLUOXETINE HCL 20 MG PO TABS: ORAL_TABLET | ORAL | Status: DC

## 2014-09-16 MED ORDER — ESOMEPRAZOLE MAGNESIUM 40 MG PO CPDR: DELAYED_RELEASE_CAPSULE | ORAL | Status: DC

## 2014-09-16 NOTE — Progress Notes (Signed)
Pre visit review using our clinic review tool, if applicable. No additional management support is needed unless otherwise documented below in the visit note. 

## 2014-09-16 NOTE — Telephone Encounter (Signed)
Resent [facility] to [facility] in [place]. Unable to reach pt.

## 2014-09-16 NOTE — Progress Notes (Signed)
Subjective:     Natalie Pitts is a 44 y.o. female c/o nausea, abd pain-hurts worse after eating, wt loss, palpitations w/pain, tearful.  Per medical records, Natalie Pitts has long Hx (years) of anxiety & GERD. H pylori stool was neg in past. She was tested for mild allergy-neg.  She does not like taking meds-says she has had several bad experiences & thinks she is very "drug sensitive". She rarely takes ativan. She takes [place] for stomach pain-gaviscon seems to help more than TUMS. Her family moved from [place] last summer-the move has been stressful. Excessive worry is evident-asks multiple qtns about meds, doses, alternatives, wonders if she has stomach cancer. All qts answered. Likely that anxiety has produced PUD. Discussed imprtance of treating both anxiety & creating low acid environment so stomach can heal. She is in agreement to starting meds, but seems hesitant.  The following portions of the patient's history were reviewed and updated as appropriate: allergies, current medications, past medical history, past social history, past surgical history and problem list.  Review of Systems Constitutional: positive for generally doesn't feel well, negative for fevers Ears, nose, mouth, throat, and face: positive for feels like something crawling in throat Respiratory: negative for cough Cardiovascular: negative for chest pressure/discomfort and lower extremity edema Gastrointestinal: positive for constipation Genitourinary:negative for abnormal menstrual periods Behavioral/Psych: positive for anxiety and excessive worry    Objective:    BP 95/67 mmHg  Pulse 91  Temp(Src) 98 F (36.7 C) (Oral)  Ht 5' 5.75" (1.67 m)  Wt 135 lb (61.236 kg)  BMI 21.96 kg/m2  SpO2 98%  LMP 09/06/2014 BP 95/67 mmHg  Pulse 91  Temp(Src) 98 F (36.7 C) (Oral)  Ht 5' 5.75" (1.67 m)  Wt 135 lb (61.236 kg)  BMI 21.96 kg/m2  SpO2 98%  LMP 09/06/2014 General appearance: alert, cooperative,  appears stated age and no distress Head: Normocephalic, without obvious abnormality, atraumatic Eyes: negative findings: lids and lashes normal and conjunctivae and sclerae normal Neck: no adenopathy, no carotid bruit, supple, symmetrical, trachea midline and thyroid not enlarged, symmetric, no tenderness/mass/nodules Lungs: clear to auscultation bilaterally Heart: regular rate and rhythm, S1, S2 normal, no murmur, click, rub or gallop Abdomen: soft, non-tender; bowel sounds normal; no masses,  no organomegaly and full L side, periumbilical tenderness    Assessment:Plan     1. Generalized anxiety disorder Start daily med - LORazepam (ATIVAN) 0.5 MG tablet; Take 1 tablet (0.5 mg total) by mouth daily as needed for anxiety.  Dispense: 30 tablet; Refill: 0 - FLUoxetine (PROZAC) 20 MG tablet; Take 1/2 T PO qhs X 7days, then 1 T po qhs  Dispense: 30 tablet; Refill: 1  2. Gastritis start - esomeprazole (NEXIUM) 40 MG capsule; 1T po qam  Dispense: 30 capsule; Refill: 1 F/u 4 weeks  See pt instructions for complete plan

## 2014-09-16 NOTE — Patient Instructions (Signed)
Please start nexium today. You may take zantac for a few days if needed for pain. Eat soft bland foods for several days. Abstain from alcoholic beverages & acidic drinks. Eat fruit, but no oranges on empty stomach.  Start fluoxetine in 2 weeks. Take ativan as needed.  Take probiotic daily (Align or Culterelle) for 3 months.  See me in 4 weeks.  Gastritis, Adult Gastritis is soreness and swelling (inflammation) of the lining of the stomach. Gastritis can develop as a sudden onset (acute) or long-term (chronic) condition. If gastritis is not treated, it can lead to stomach bleeding and ulcers. CAUSES  Gastritis occurs when the stomach lining is weak or damaged. Digestive juices from the stomach then inflame the weakened stomach lining. The stomach lining may be weak or damaged due to viral or bacterial infections. One common bacterial infection is the Helicobacter pylori infection. Gastritis can also result from excessive alcohol consumption, taking certain medicines, or having too much acid in the stomach.  SYMPTOMS  In some cases, there are no symptoms. When symptoms are present, they may include:  Pain or a burning sensation in the upper abdomen.  Nausea.  Vomiting.  An uncomfortable feeling of fullness after eating. DIAGNOSIS  Your caregiver may suspect you have gastritis based on your symptoms and a physical exam. To determine the cause of your gastritis, your caregiver may perform the following:  Blood or stool tests to check for the H pylori bacterium.  Gastroscopy. A thin, flexible tube (endoscope) is passed down the esophagus and into the stomach. The endoscope has a light and camera on the end. Your caregiver uses the endoscope to view the inside of the stomach.  Taking a tissue sample (biopsy) from the stomach to examine under a microscope. TREATMENT  Depending on the cause of your gastritis, medicines may be prescribed. If you have a bacterial infection, such as an H  pylori infection, antibiotics may be given. If your gastritis is caused by too much acid in the stomach, H2 blockers or antacids may be given. Your caregiver may recommend that you stop taking aspirin, ibuprofen, or other nonsteroidal anti-inflammatory drugs (NSAIDs). HOME CARE INSTRUCTIONS  Only take over-the-counter or prescription medicines as directed by your caregiver.  If you were given antibiotic medicines, take them as directed. Finish them even if you start to feel better.  Drink enough fluids to keep your urine clear or pale yellow.  Avoid foods and drinks that make your symptoms worse, such as:  Caffeine or alcoholic drinks.  Chocolate.  Peppermint or mint flavorings.  Garlic and onions.  Spicy foods.  Citrus fruits, such as oranges, lemons, or limes.  Tomato-based foods such as sauce, chili, salsa, and pizza.  Fried and fatty foods.  Eat small, frequent meals instead of large meals. SEEK IMMEDIATE MEDICAL CARE IF:   You have black or dark red stools.  You vomit blood or material that looks like coffee grounds.  You are unable to keep fluids down.  Your abdominal pain gets worse.  You have a fever.  You do not feel better after 1 week.  You have any other questions or concerns. MAKE SURE YOU:  Understand these instructions.  Will watch your condition.  Will get help right away if you are not doing well or get worse. Document Released: 07/26/2001 Document Revised: 01/31/2012 Document Reviewed: 09/14/2011 [facility] Patient Information 2015 [place], [place]. This information is not intended to replace advice given to you by your health care provider. Make sure you  discuss any questions you have with your health care provider.  

## 2014-09-16 NOTE — Assessment & Plan Note (Signed)
Rarely uses ativan Excessive worry Likely cause of gastritis Needs daily med. Thinks she took high dose celexa X 2 weeks, made anxiety worse. Felt nauseous after stopped med. Did not wean. Start low dose fluoxetine. Discussed possible SE & duration of treatment-9 to 18 mos. She agrees to start in 2 weeks

## 2014-09-16 NOTE — Assessment & Plan Note (Addendum)
This is likely anxiety induced. She is hestiant to take meds. Uses PRN gaviscon & TUMS w/minimal relief. Discussed reasons for treatment & duation: 4 weeks & re-evaL. Soft bland foods, no ETOH, no acidic foods Start probiotic qd X 3 mos

## 2014-09-25 ENCOUNTER — Telehealth: Payer: Self-pay | Admitting: *Deleted

## 2014-09-25 NOTE — Telephone Encounter (Signed)
Response is written on denial letter. Please see & appeal.

## 2014-09-25 NOTE — Telephone Encounter (Signed)
Received PA request for Nexium from Natalie Pitts. PA was denied. Denial letter with reason is on providers desk.

## 2014-09-25 NOTE — Telephone Encounter (Signed)
Received PA request for Nexium from [place]. PA was denied. Denial letter with reason is on providers desk.

## 2014-10-02 NOTE — Telephone Encounter (Signed)
[employer] will not approve Nexium. Spoke to patient. Patient stated that she is not going to take the rx for Nexium at this time. Patient bought over the counter.

## 2014-10-07 ENCOUNTER — Telehealth: Payer: Self-pay | Admitting: Nurse Practitioner

## 2014-10-07 NOTE — Telephone Encounter (Signed)
Pt called and asked that Layne call her back asap. She would like a referral to an Inpt facility or a counselor as her anxiety is getting unbearable.

## 2014-10-07 NOTE — Telephone Encounter (Signed)
She must be seen either here or in ER. Please explain if she comes here, a referral visit will take 3-4 weeks. If she feels she is in crisis, she should go to ER at [facility] where thay have psych ER. A psych eval will be done & she will be admitted or outpatient follow up will be planned.

## 2014-10-07 NOTE — Telephone Encounter (Signed)
Spoke to [place] and gave her the options that are in [place] note. She was tearful and did not know what to do but realized she needs help. She would think about the options and let us know what she would do.[place]

## 2014-10-09 ENCOUNTER — Encounter (HOSPITAL_COMMUNITY): Payer: Self-pay | Admitting: Emergency Medicine

## 2014-10-09 ENCOUNTER — Emergency Department (HOSPITAL_COMMUNITY)
Admission: EM | Admit: 2014-10-09 | Discharge: 2014-10-09 | Disposition: A | Discharge status: Home / Self Care | Payer: BLUE CROSS/BLUE SHIELD | Attending: Emergency Medicine | Admitting: Emergency Medicine

## 2014-10-09 DIAGNOSIS — R110 Nausea: Secondary | ICD-10-CM | POA: Insufficient documentation

## 2014-10-09 DIAGNOSIS — R1013 Epigastric pain: Secondary | ICD-10-CM | POA: Diagnosis not present

## 2014-10-09 DIAGNOSIS — Z8679 Personal history of other diseases of the circulatory system: Secondary | ICD-10-CM | POA: Diagnosis not present

## 2014-10-09 DIAGNOSIS — F419 Anxiety disorder, unspecified: Secondary | ICD-10-CM | POA: Diagnosis not present

## 2014-10-09 DIAGNOSIS — R630 Anorexia: Secondary | ICD-10-CM | POA: Diagnosis not present

## 2014-10-09 DIAGNOSIS — K219 Gastro-esophageal reflux disease without esophagitis: Secondary | ICD-10-CM | POA: Insufficient documentation

## 2014-10-09 DIAGNOSIS — R109 Unspecified abdominal pain: Secondary | ICD-10-CM | POA: Diagnosis present

## 2014-10-09 LAB — CBC WITH DIFFERENTIAL/PLATELET
BASOS ABS: 0 10*3/uL (ref 0.0–0.1)
Basophils Relative: 0 % (ref 0–1)
Eosinophils Absolute: 0.1 10*3/uL (ref 0.0–0.7)
Eosinophils Relative: 2 % (ref 0–5)
HEMATOCRIT: 39.8 % (ref 36.0–46.0)
HEMOGLOBIN: 12.8 g/dL (ref 12.0–15.0)
LYMPHS ABS: 2.2 10*3/uL (ref 0.7–4.0)
Lymphocytes Relative: 31 % (ref 12–46)
MCH: 27.5 pg (ref 26.0–34.0)
MCHC: 32.2 g/dL (ref 30.0–36.0)
MCV: 85.6 fL (ref 78.0–100.0)
MONO ABS: 0.5 10*3/uL (ref 0.1–1.0)
Monocytes Relative: 8 % (ref 3–12)
NEUTROS ABS: 4.1 10*3/uL (ref 1.7–7.7)
Neutrophils Relative %: 59 % (ref 43–77)
Platelets: 243 10*3/uL (ref 150–400)
RBC: 4.65 MIL/uL (ref 3.87–5.11)
RDW: 12.6 % (ref 11.5–15.5)
WBC: 7 10*3/uL (ref 4.0–10.5)

## 2014-10-09 LAB — COMPREHENSIVE METABOLIC PANEL
ALT: 13 U/L (ref 0–35)
AST: 18 U/L (ref 0–37)
Albumin: 3.9 g/dL (ref 3.5–5.2)
Alkaline Phosphatase: 42 U/L (ref 39–117)
Anion gap: 7 (ref 5–15)
BUN: 11 mg/dL (ref 6–23)
CALCIUM: 9.2 mg/dL (ref 8.4–10.5)
CO2: 27 mmol/L (ref 19–32)
Chloride: 105 mmol/L (ref 96–112)
Creatinine, Ser: 0.73 mg/dL (ref 0.50–1.10)
Glucose, Bld: 88 mg/dL (ref 70–99)
Potassium: 3.6 mmol/L (ref 3.5–5.1)
SODIUM: 139 mmol/L (ref 135–145)
TOTAL PROTEIN: 7 g/dL (ref 6.0–8.3)
Total Bilirubin: 0.4 mg/dL (ref 0.3–1.2)

## 2014-10-09 LAB — URINALYSIS, ROUTINE W REFLEX MICROSCOPIC
Bilirubin Urine: NEGATIVE
GLUCOSE, UA: NEGATIVE mg/dL
Hgb urine dipstick: NEGATIVE
Ketones, ur: NEGATIVE mg/dL
LEUKOCYTES UA: NEGATIVE
NITRITE: NEGATIVE
PROTEIN: NEGATIVE mg/dL
Specific Gravity, Urine: 1.022 (ref 1.005–1.030)
Urobilinogen, UA: 0.2 mg/dL (ref 0.0–1.0)
pH: 6 (ref 5.0–8.0)

## 2014-10-09 LAB — LIPASE, BLOOD: Lipase: 40 U/L (ref 11–59)

## 2014-10-09 NOTE — ED Notes (Signed)
EDP JAMES PRESENT at bedside.

## 2014-10-09 NOTE — ED Notes (Signed)
Increased burning and pain comes and goes with stomach. Took prilosec ? Not helping

## 2014-10-09 NOTE — ED Notes (Signed)
Pt states that she has a lot going on (and got teary eyed).  Pt states that she has had intermittent adb pain with nause over the past couple of weeks. Pt has GERD and pt started Prilosec 3 days ago so cant tell if it is working or not.  Pt states that she also gets palpitations after the abd pain occurs.  Pt states that she moved here about 6 months ago and her son who lives out of states with his father is having issues with Suicide and pt's husband is having health issues with his heart.  Pt states that she wakes up during the night so cant get good sleep.  Pt also states that she lost weight over the past couple weeks approx 5 pounds.   Pt denies SI-HI at this time, but states that she has moments at times that "I cant live like this and I dont know how to deal with what all is going on".

## 2014-10-09 NOTE — ED Notes (Signed)
Pt does not want EKG done, pt wants an CT of her abdomen

## 2014-10-14 NOTE — ED Provider Notes (Signed)
CSN: [id number]     Arrival date & time 10/09/14  1452 History   First MD Initiated Contact with Patient 10/09/14 1715     Chief Complaint  Patient presents with  . Abdominal Pain  . Palpitations  . Anxiety      HPI  Patient just evaluation with multiple complaints. This is under a lot of stress. He is initially palpitations when she gets abdominal pain. His epigastric. This better a lot of stress. Patient wakes up at night and has burning in her upper epigastrium has lost weight because of poor appetite over the last several months. Admits that she is anxious and tearful and depressed at times. Is being suicidal.   Past Medical History  Diagnosis Date  . Migraine with aura   . GERD (gastroesophageal reflux disease)   . Anxiety    History reviewed. No pertinent past surgical history. Family History  Problem Relation Age of Onset  . Diabetes Mother   . Hyperlipidemia Mother   . Hypertension Mother   . Hypertension Father   . Cancer Father     prostate  . Endometriosis Sister   . Alcohol abuse Brother   . Diabetes Maternal Aunt   . Diabetes Brother    History  Substance Use Topics  . Smoking status: Never Smoker   . Smokeless tobacco: Not on file  . Alcohol Use: No   OB History    No data available     Review of Systems  Constitutional: Positive for appetite change. Negative for fever, chills, diaphoresis and fatigue.  HENT: Negative for mouth sores, sore throat and trouble swallowing.   Eyes: Negative for visual disturbance.  Respiratory: Negative for cough, chest tightness, shortness of breath and wheezing.   Cardiovascular: Negative for chest pain.  Gastrointestinal: Positive for nausea and abdominal pain. Negative for vomiting, diarrhea and abdominal distention.  Endocrine: Negative for polydipsia, polyphagia and polyuria.  Genitourinary: Negative for dysuria, frequency and hematuria.  Musculoskeletal: Negative for gait problem.  Skin: Negative for color  change, pallor and rash.  Neurological: Negative for dizziness, syncope, light-headedness and headaches.  Hematological: Does not bruise/bleed easily.  Psychiatric/Behavioral: Positive for dysphoric mood. Negative for behavioral problems and confusion. The patient is nervous/anxious.       Allergies  Penicillins; Celexa; Doxycycline hyclate; and Prilosec  Home Medications   Prior to Admission medications   Medication Sig Start Date End Date Taking? Authorizing Provider  Calcium Carbonate Antacid (TUMS E-X PO) Take 1 tablet by mouth daily as needed (nausea and stomach pain).   Yes Historical Provider, MD  esomeprazole (NEXIUM) 40 MG capsule 1T po qam Patient taking differently: Take 40 mg by mouth daily. 1T po qam 09/16/14  Yes Irene Pap, NP  LORazepam (ATIVAN) 0.5 MG tablet Take 1 tablet (0.5 mg total) by mouth daily as needed for anxiety. 09/16/14  Yes Irene Pap, NP  FLUoxetine (PROZAC) 20 MG tablet Take 1/2 T PO qhs X 7days, then 1 T po qhs Patient not taking: Reported on 10/09/2014 09/16/14   Irene Pap, NP  Vitamin D, Ergocalciferol, (DRISDOL) 50000 UNITS CAPS capsule Take 1 capsule (50,000 Units total) by mouth every 7 (seven) days. Patient not taking: Reported on 10/09/2014 06/26/14   Silverio Decamp, MD   BP 151/99 mmHg  Pulse 118  Temp(Src) 98.2 F (36.8 C) (Oral)  Resp 20  SpO2 100%  LMP 09/01/2014 Physical Exam  Constitutional: She is oriented to person, place, and time. She appears well-developed  and well-nourished. No distress.  HENT:  Head: Normocephalic.  Eyes: Conjunctivae are normal. Pupils are equal, round, and reactive to light. No scleral icterus.  Neck: Normal range of motion. Neck supple. No thyromegaly present.  Cardiovascular: Normal rate and regular rhythm.  Exam reveals no gallop and no friction rub.   No murmur heard. Pulmonary/Chest: Effort normal and breath sounds normal. No respiratory distress. She has no wheezes. She has no rales.   Abdominal: Soft. Bowel sounds are normal. She exhibits no distension. There is no tenderness. There is no rebound.  Epigastric tenderness. No guarding rebound or peritoneal irritation.  Musculoskeletal: Normal range of motion.  Neurological: She is alert and oriented to person, place, and time.  Skin: Skin is warm and dry. No rash noted.  Psychiatric: She has a normal mood and affect. Her behavior is normal.  Anxious and tearful.    ED Course  Procedures (including critical care time) Labs Review Labs Reviewed  URINALYSIS, ROUTINE W REFLEX MICROSCOPIC - Abnormal; Notable for the following:    APPearance CLOUDY (*)    All other components within normal limits  CBC WITH DIFFERENTIAL/PLATELET  COMPREHENSIVE METABOLIC PANEL  LIPASE, BLOOD    Imaging Review No results found.   EKG Interpretation None      MDM   Final diagnoses:  Epigastric pain    Patient with epigastric pain. Presents with symptoms that sound most like a gastritis. Benign exam. Reassuring labs. She is difficult to reassure she remains anxious. She states she rather go see her regular doctor area and states "it took a gas along the given here to see me I just don't trust you have. Did discuss with her that it was a busy night and apologize profusely for her long wait. I think she is appropriate for outpatient follow-up with her primary care physician.    Tanna Furry, MD 10/14/14 [phone]

## 2014-10-28 ENCOUNTER — Encounter: Payer: BLUE CROSS/BLUE SHIELD | Admitting: Obstetrics & Gynecology

## 2014-10-30 ENCOUNTER — Telehealth: Payer: Self-pay

## 2014-10-30 NOTE — Telephone Encounter (Signed)
She does not need referral for OB/gyn. There are many good practices in [place]. She should check with insurance to see who is in her network. I recommend she see psychiatry for her anxiety. Would she like a referral?

## 2014-10-30 NOTE — Telephone Encounter (Signed)
Called and spoke with patient. Every month when she gets her period as she gets older, symptoms getting worse. Anxiety and panic attacks. Not drinking any caffeine, if she has a few sips it makes things worse. She has someone in mind for who she would like to see in OB/gyn and psychiatry. No referral needed right now.

## 2014-10-30 NOTE — Telephone Encounter (Signed)
Patient called and is concerned about her anxiety during period. Was wondering if there was any vitamins that she could take or if she needed to go to OB/GYN for this. She currently does not have an OB/GYN to go too.

## 2014-11-05 ENCOUNTER — Encounter: Payer: Self-pay | Admitting: Nurse Practitioner

## 2014-11-05 ENCOUNTER — Ambulatory Visit (INDEPENDENT_AMBULATORY_CARE_PROVIDER_SITE_OTHER): Payer: BLUE CROSS/BLUE SHIELD | Admitting: Nurse Practitioner

## 2014-11-05 VITALS — BP 115/79 | HR 78 | Temp 98.5°F | Ht 65.75 in | Wt 137.0 lb

## 2014-11-05 DIAGNOSIS — R6889 Other general symptoms and signs: Secondary | ICD-10-CM

## 2014-11-05 DIAGNOSIS — F411 Generalized anxiety disorder: Secondary | ICD-10-CM

## 2014-11-05 NOTE — Progress Notes (Signed)
Subjective:    Natalie Pitts is a 44 y.o. female who presents for evaluation of "weird sensation in head". Symptoms began about 1 week ago. Associated symptoms are nausea, hand tremor, fatigue, temporary blurred vision & hearing loss, difficulty finding words. Episodes last few minutes to an hour and are relieved by rest, although she woke once with "weird sensation in head". She thinks her [facility] has something to do with episodes-had cycle last week. She went to ER yesterday "to get a CT scan" but left before being seen due to 2 hr wait. She is concerned that something is wrong & worries that she may have "seizure or something" while driving her kids around. She had dilated exam few days ago. Her prescription changed slightly, otherwise nml exam per pt. She denies fever, sore throat, ear pain. She had occasional HA over last week relieved by tylenol. We had lengthy discussion that all her symptoms may be due to untreated anxiety. Pt wants to rule out physical causes before agreeing to take "daily med" for anxiety. HX: Per historical records, pt C/o dizzy 6/15/'11. W/u included MRI brain & neuro ref-nml. She was in ER last month c/o "hopelessness". Unfortunately, she did not get a psych consult and was d/c'd home. She is not feeling hopeless or suicidal today. 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 She has appt w/gyn next month to discuss PMS treatment.  She made 2 recent attempts to see phsychiatry: 1st attempt-appt cancelled due to insurance issues, 2nd attempt-"playing phone tag" to get appt. Scheduled: "Ifinally gave up, It isn't working out for me to see someone". She is not willing to start SSRI today. I prescribed prozac few mos ago, but she did not start. Used celexa in past with heightened anxiety. She is not taking omeprazole: states stomach pain has resolved with diet changes.   The following portions  of the patient's history were reviewed and updated as appropriate: allergies, current medications, past medical history, past social history, past surgical history and problem list.  Review of Systems Cardiovascular: positive for occasional palpitations Neurological: negative for coordination problems, gait problems, paresthesia and seizures Behavioral/Psych: negative for excessive alcohol consumption, illegal drug usage and loss of interest in favorite activities    Objective:    BP 115/79 mmHg  Pulse 78  Temp(Src) 98.5 F (36.9 C) (Temporal)  Ht 5' 5.75" (1.67 m)  Wt 137 lb (62.143 kg)  BMI 22.28 kg/m2  SpO2 97%  LMP 10/30/2014 General appearance: alert, cooperative, appears stated age, mild distress and ruminates-worry over many things Head: Normocephalic, without obvious abnormality, atraumatic Eyes: negative findings: lids and lashes normal, conjunctivae and sclerae normal, corneas clear, pupils equal, round, reactive to light and accomodation and visual fields full to confrontation Ears: normal TM's and external ear canals both ears Throat: lips, mucosa, and tongue normal; teeth and gums normal Neck: no adenopathy, no carotid bruit, supple, symmetrical, trachea midline and thyroid not enlarged, symmetric, no tenderness/mass/nodules Lungs: clear to auscultation bilaterally Heart: regular rate and rhythm, S1, S2 normal, no murmur, click, rub or gallop Neurologic: Grossly normal    Assessment:Plan  1. Head sensory problem Likely r/t anxiety Exam & Hx do not warrant imaging Ref to neuro for further eval.  - Ambulatory referral to Neurology  2. Generalized anxiety disorder Offered to start SSRI/ SNRI Pt refuses Continue ativan PRN Encourage psych eval, ref given.  F/u 4 wks, sooner  if needed

## 2014-11-05 NOTE — Progress Notes (Signed)
Pre visit review using our clinic review tool, if applicable. No additional management support is needed unless otherwise documented below in the visit note. 

## 2014-11-05 NOTE — Assessment & Plan Note (Signed)
Pt hesitant to accept Dx of anxiety. She feels there is "physical cause" for symptoms. Failed attempts to see psychiatry. Refuses to start antidepressant today. Continue ativan PRN

## 2014-11-05 NOTE — Patient Instructions (Signed)
Please see neurologist for further work up.  Please see me in 4 weeks to review or sooner if you decide to start low dose antidepressant.

## 2014-11-19 ENCOUNTER — Encounter: Payer: BLUE CROSS/BLUE SHIELD | Admitting: Obstetrics & Gynecology

## 2014-12-17 ENCOUNTER — Telehealth: Payer: Self-pay | Admitting: Nurse Practitioner

## 2014-12-17 NOTE — Telephone Encounter (Signed)
Patient would like to start seeing a provider at the [facility] location. Is this ok? Thanks!

## 2014-12-17 NOTE — Telephone Encounter (Signed)
Absolutely!  Thank you.

## 2015-09-15 ENCOUNTER — Ambulatory Visit: Payer: BLUE CROSS/BLUE SHIELD | Admitting: Family Medicine

## 2015-09-22 ENCOUNTER — Ambulatory Visit: Payer: BLUE CROSS/BLUE SHIELD | Admitting: Family Medicine

## 2015-09-23 ENCOUNTER — Encounter: Payer: Self-pay | Admitting: Family Medicine

## 2015-09-23 ENCOUNTER — Ambulatory Visit (INDEPENDENT_AMBULATORY_CARE_PROVIDER_SITE_OTHER): Payer: 59 | Admitting: Family Medicine

## 2015-09-23 VITALS — BP 104/72 | HR 99 | Temp 98.1°F | Resp 20 | Ht 66.0 in | Wt 147.2 lb

## 2015-09-23 DIAGNOSIS — R002 Palpitations: Secondary | ICD-10-CM | POA: Diagnosis not present

## 2015-09-23 LAB — BASIC METABOLIC PANEL
BUN: 14 mg/dL (ref 6–23)
CO2: 28 meq/L (ref 19–32)
Calcium: 9 mg/dL (ref 8.4–10.5)
Chloride: 105 mEq/L (ref 96–112)
Creatinine, Ser: 0.8 mg/dL (ref 0.40–1.20)
GFR: 83.07 mL/min (ref >60.00)
GLUCOSE: 78 mg/dL (ref 70–99)
POTASSIUM: 4.1 meq/L (ref 3.5–5.1)
SODIUM: 141 meq/L (ref 135–145)

## 2015-09-23 LAB — CBC WITH DIFFERENTIAL/PLATELET
Basophils Absolute: 0 10*3/uL (ref 0.0–0.1)
Basophils Relative: 0.6 % (ref 0.0–3.0)
Eosinophils Absolute: 0.1 10*3/uL (ref 0.0–0.7)
Eosinophils Relative: 1.3 % (ref 0.0–5.0)
HEMATOCRIT: 40.7 % (ref 36.0–46.0)
Hemoglobin: 13.2 g/dL (ref 12.0–15.0)
LYMPHS PCT: 31.3 % (ref 12.0–46.0)
Lymphs Abs: 1.9 10*3/uL (ref 0.7–4.0)
MCHC: 32.4 g/dL (ref 30.0–36.0)
MCV: 84.3 fl (ref 78.0–100.0)
MONO ABS: 0.5 10*3/uL (ref 0.1–1.0)
Monocytes Relative: 7.8 % (ref 3.0–12.0)
Neutro Abs: 3.5 10*3/uL (ref 1.4–7.7)
Neutrophils Relative %: 59 % (ref 43.0–77.0)
Platelets: 260 10*3/uL (ref 150.0–400.0)
RBC: 4.83 Mil/uL (ref 3.87–5.11)
RDW: 13.9 % (ref 11.5–15.5)
WBC: 6 10*3/uL (ref 4.0–10.5)

## 2015-09-23 LAB — IRON AND TIBC
%SAT: 30 % (ref 11–50)
Iron: 90 ug/dL (ref 40–190)
TIBC: 303 ug/dL (ref 250–450)
UIBC: 213 ug/dL (ref 125–400)

## 2015-09-23 LAB — FERRITIN: Ferritin: 11.4 ng/mL (ref 10.0–291.0)

## 2015-09-23 NOTE — Progress Notes (Signed)
Patient ID: Natalie Pitts, female   DOB: June 18, 1971, 45 y.o.   MRN: [id number]    Natalie Pitts , 1971-07-04, 45 y.o., female MRN: [id number]  CC: Palpitations Subjective: Heart palpitations: She presents for an acute office visit secondary to heart palpitations. Patient states her heart palpitations returned approximate 2 months ago. She states she has experienced palpitations in the past and was told it was secondary to anxiety and stress. She states she is concerned she is getting heart disease, because she has a family member that has heart disease. Patient denies any stimulant, caffeine or alcohol use. She is not been started on any new medications or over-the-counter medications. She reports the palpitations occur 3-4 times a week, feels like a flutter and lasts only a few seconds. She feels like when she lays down there isn't a "elephant on her chest "she sometimes feels like she is "suffocating". She denies dyspnea or chest pain. She denies dizziness,  lower extremity edema, diaphoresis or syncope. Patient states she does eat a more sedentary lifestyle. Patient has a history of sleep apnea. Recent study shows mild sleep apnea and recommendations were made, other than caution with her Ativan use. Since then patient states she hasn't been using her Ativan as routinely. Patient doesn't know she had a good sleep study because she states she was able to get much sleep during the study. Patient is currently being evaluated by her gynecologist for irregular/heavy menses has worsened over the last 3 months with known fibroids. Patient has had a TS H collected at her OB/GYN this week, faxed report shows normal TSH at 0.984. OB/GYn Dr. Patricia Pesa ([place]).  Allergies  Allergen Reactions  . Penicillins Hives  . Celexa [Citalopram Hydrobromide]   . Doxycycline Hyclate Diarrhea  . Prilosec [Omeprazole]    Social History  Substance Use Topics  . Smoking status: Never Smoker   . Smokeless  tobacco: Not on file  . Alcohol Use: No   Past Medical History  Diagnosis Date  . Migraine with aura   . GERD (gastroesophageal reflux disease)   . Anxiety    History reviewed. No pertinent past surgical history. Family History  Problem Relation Age of Onset  . Diabetes Mother   . Hyperlipidemia Mother   . Hypertension Mother   . Hypertension Father   . Cancer Father     prostate  . Endometriosis Sister   . Alcohol abuse Brother   . Diabetes Maternal Aunt   . Diabetes Brother      Medication List       This list is accurate as of: 09/23/15 10:31 AM.  Always use your most recent med list.               LORazepam 0.5 MG tablet  Commonly known as:  ATIVAN  Take 1 tablet (0.5 mg total) by mouth daily as needed for anxiety.     TUMS E-X PO  Take 1 tablet by mouth daily as needed (nausea and stomach pain).       ROS: Negative, with the exception of above mentioned in HPI  Objective:  BP 104/72 mmHg  Pulse 99  Temp(Src) 98.1 F (36.7 C)  Resp 20  Ht 5\' 6"  (1.676 m)  Wt 147 lb 4 oz (66.792 kg)  BMI 23.78 kg/m2  SpO2 99%  LMP 09/16/2015 Body mass index is 23.78 kg/(m^2). Gen: Afebrile. No acute distress. Well-developed, well-nourished, Caucasian female. HENT: AT. [place].MMM, no oral lesions.  Eyes:Pupils Equal Round Reactive to light,  Extraocular movements intact,  Conjunctiva without redness, discharge or icterus. Neck/lymp/endocrine: Supple, no lymphadenopathy, no thyromegaly CV: RRR no m/c/g/r, no edema, +2/4 P posterior tibialis pulses Chest: CTAB, no wheeze or crackles. Good air movement, normal resp effort.  Abd: Soft. NTND. BS present.  Neuro: Normal gait. PERLA. EOMi. Alert. Oriented x3  Psych: Anxious. Normal speech. Mildly paranoid surrounding different medical conditions.  Assessment/Plan: Natalie Pitts is a 45 y.o. female present for acute OV and to meet new provider. 1. Palpitations - Discussed vast differential of possible reasoning for  palpitations. Patient concerned she has "heart disease". Patient declined EKG in the office. TSH normal. - Patient was provided with education on palpitations. She was encouraged to maintain adequate hydration. - Considering heavy menses will collect blood work today to rule out anemia/iron deficiency. - Possibly anxiety driven, this was discussed with patient today. She is not amendable to additional pharmacological treatment. - Discussed with patient her sleep apnea may be the cause of her sensation during her sleep. AVS on sleep apnea provided to patient today. Patient was encouraged to sleep on her side to see if it relieves symptoms. - CBC w/Diff - Basic Metabolic Panel (BMET) - Ferritin - Iron Binding Cap (TIBC) - She will be called with the above results, if normal and she would like to pursue additional workup would refer to cardiology for Holter monitoring, and further eval.  > 25 minutes spent with patient, >50% of time spent face to face counseling patient and coordinating care.  Renee Raoul Pitch, DO  [place]

## 2015-09-23 NOTE — Patient Instructions (Signed)
Palpitations A palpitation is the feeling that your heartbeat is irregular or is faster than normal. It may feel like your heart is fluttering or skipping a beat. Palpitations are usually not a serious problem. However, in some cases, you may need further medical evaluation. CAUSES  Palpitations can be caused by:  Smoking.  Caffeine or other stimulants, such as diet pills or energy drinks.  Alcohol.  Stress and anxiety.  Strenuous physical activity.  Fatigue.  Certain medicines.  Heart disease, especially if you have a history of irregular heart rhythms (arrhythmias), such as atrial fibrillation, atrial flutter, or supraventricular tachycardia.  An improperly working pacemaker or defibrillator. DIAGNOSIS  To find the cause of your palpitations, your health care provider will take your medical history and perform a physical exam. Your health care provider may also have you take a test called an ambulatory electrocardiogram (ECG). An ECG records your heartbeat patterns over a 24-hour period. You may also have other tests, such as: 1. Transthoracic echocardiogram (TTE). During echocardiography, sound waves are used to evaluate how blood flows through your heart. 2. Transesophageal echocardiogram (TEE). 3. Cardiac monitoring. This allows your health care provider to monitor your heart rate and rhythm in real time. 4. Holter monitor. This is a portable device that records your heartbeat and can help diagnose heart arrhythmias. It allows your health care provider to track your heart activity for several days, if needed. 5. Stress tests by exercise or by giving medicine that makes the heart beat faster. TREATMENT  Treatment of palpitations depends on the cause of your symptoms and can vary greatly. Most cases of palpitations do not require any treatment other than time, relaxation, and monitoring your symptoms. Other causes, such as atrial fibrillation, atrial flutter, or supraventricular  tachycardia, usually require further treatment. HOME CARE INSTRUCTIONS   Avoid:  Caffeinated coffee, tea, soft drinks, diet pills, and energy drinks.  Chocolate.  Alcohol.  Stop smoking if you smoke.  Reduce your stress and anxiety. Things that can help you relax include:  A method of controlling things in your body, such as your heartbeats, with your mind (biofeedback).  Yoga.  Meditation.  Physical activity such as swimming, jogging, or walking.  Get plenty of rest and sleep. SEEK MEDICAL CARE IF:   You continue to have a fast or irregular heartbeat beyond 24 hours.  Your palpitations occur more often. SEEK IMMEDIATE MEDICAL CARE IF:  You have chest pain or shortness of breath.  You have a severe headache.  You feel dizzy or you faint. MAKE SURE YOU:  Understand these instructions.  Will watch your condition.  Will get help right away if you are not doing well or get worse.   This information is not intended to replace advice given to you by your health care provider. Make sure you discuss any questions you have with your health care provider.   Document Released: 07/29/2000 Document Revised: 08/06/2013 Document Reviewed: 09/30/2011 Elsevier Interactive Patient Education 2016 Elsevier Inc.   Sleep Apnea  Sleep apnea is a sleep disorder characterized by abnormal pauses in breathing while you sleep. When your breathing pauses, the level of oxygen in your blood decreases. This causes you to move out of deep sleep and into light sleep. As a result, your quality of sleep is poor, and the system that carries your blood throughout your body (cardiovascular system) experiences stress. If sleep apnea remains untreated, the following conditions can develop:  High blood pressure (hypertension).  Coronary artery disease.  Inability  to achieve or maintain an erection (impotence).  Impairment of your thought process (cognitive dysfunction). There are three types of  sleep apnea: 6. Obstructive sleep apnea--Pauses in breathing during sleep because of a blocked airway. 7. Central sleep apnea--Pauses in breathing during sleep because the area of the brain that controls your breathing does not send the correct signals to the muscles that control breathing. 8. Mixed sleep apnea--A combination of both obstructive and central sleep apnea. RISK FACTORS The following risk factors can increase your risk of developing sleep apnea:  Being overweight.  Smoking.  Having narrow passages in your nose and throat.  Being of older age.  Being female.  Alcohol use.  Sedative and tranquilizer use.  Ethnicity. Among individuals younger than 35 years, African Americans are at increased risk of sleep apnea. SYMPTOMS   Difficulty staying asleep.  Daytime sleepiness and fatigue.  Loss of energy.  Irritability.  Loud, heavy snoring.  Morning headaches.  Trouble concentrating.  Forgetfulness.  Decreased interest in sex.  Unexplained sleepiness. DIAGNOSIS  In order to diagnose sleep apnea, your caregiver will perform a physical examination. A sleep study done in the comfort of your own home may be appropriate if you are otherwise healthy. Your caregiver may also recommend that you spend the night in a sleep lab. In the sleep lab, several monitors record information about your heart, lungs, and brain while you sleep. Your leg and arm movements and blood oxygen level are also recorded. TREATMENT The following actions may help to resolve mild sleep apnea:  Sleeping on your side.   Using a decongestant if you have nasal congestion.   Avoiding the use of depressants, including alcohol, sedatives, and narcotics.   Losing weight and modifying your diet if you are overweight. There also are devices and treatments to help open your airway:  Oral appliances. These are custom-made mouthpieces that shift your lower jaw forward and slightly open your bite. This  opens your airway.  Devices that create positive airway pressure. This positive pressure "splints" your airway open to help you breathe better during sleep. The following devices create positive airway pressure:  Continuous positive airway pressure (CPAP) device. The CPAP device creates a continuous level of air pressure with an air pump. The air is delivered to your airway through a mask while you sleep. This continuous pressure keeps your airway open.  Nasal expiratory positive airway pressure (EPAP) device. The EPAP device creates positive air pressure as you exhale. The device consists of single-use valves, which are inserted into each nostril and held in place by adhesive. The valves create very little resistance when you inhale but create much more resistance when you exhale. That increased resistance creates the positive airway pressure. This positive pressure while you exhale keeps your airway open, making it easier to breath when you inhale again.  Bilevel positive airway pressure (BPAP) device. The BPAP device is used mainly in patients with central sleep apnea. This device is similar to the CPAP device because it also uses an air pump to deliver continuous air pressure through a mask. However, with the BPAP machine, the pressure is set at two different levels. The pressure when you exhale is lower than the pressure when you inhale.  Surgery. Typically, surgery is only done if you cannot comply with less invasive treatments or if the less invasive treatments do not improve your condition. Surgery involves removing excess tissue in your airway to create a wider passage way.   This information is not  intended to replace advice given to you by your health care provider. Make sure you discuss any questions you have with your health care provider.   Document Released: 07/22/2002 Document Revised: 08/22/2014 Document Reviewed: 12/08/2011 Elsevier Interactive Patient Education [employer].

## 2015-09-24 ENCOUNTER — Telehealth: Payer: Self-pay | Admitting: Family Medicine

## 2015-09-24 NOTE — Telephone Encounter (Signed)
Please call patient: She was seen yesterday for palpitations, all of her lab work is normal. All of her electrolytes are normal, her kidneys are functioning normal. She has no signs of infection or anemia, and her iron levels are normal. If she would like to pursue further workup on her palpitations, which could refer to cardiology, and he would likely perform an EKG and Holter monitoring to try to capture events.  - At the time of her suspicious that she did not want an EKG.

## 2015-09-24 NOTE — Telephone Encounter (Signed)
Spoke with patient reviewed lab results. Patient states she will let us know if she decides she wants referral to cardiology for palpitations she declines at this time.

## 2015-09-28 ENCOUNTER — Telehealth: Payer: Self-pay | Admitting: Family Medicine

## 2015-09-28 NOTE — Telephone Encounter (Signed)
[place] [facility] - Client [facility]  Patient Name: Natalie Pitts  DOB: [birth year]    Initial Comment Caller states she's having constant pain in her right eye.    Nurse Assessment  Nurse: Wayne Sever, RN, Tillie Rung Date/Time (Eastern Time): 09/28/2015 2:39:25 PM  Confirm and document reason for call. If symptomatic, describe symptoms. You must click the next button to save text entered. ---Caller states she has a pain in right eye that has been there 3 to 4 days. She denies any new vision trouble she wears contacts and glasses normally.  Has the patient traveled out of the country within the last 30 days? ---Not Applicable  Does the patient have any new or worsening symptoms? ---Yes  Will a triage be completed? ---Yes  Related visit to physician within the last 2 weeks? ---No  Does the PT have any chronic conditions? (i.e. diabetes, asthma, etc.) ---No  Is the patient pregnant or possibly pregnant? (Ask all females between the ages of 105-55) ---No  Is this a behavioral health or substance abuse call? ---No     Guidelines    Guideline Title Affirmed Question Affirmed Notes  Eye Pain Eye pain present > 24 hours    Final Disposition User   See Physician within 24 Hours Wayne Sever, RN, [employer]    Referrals  REFERRED TO PCP OFFICE   Disagree/Comply: Leta Baptist

## 2015-09-28 NOTE — Telephone Encounter (Signed)
FYI: patient has an appointment scheduled by [facility] tomorrow.

## 2015-09-29 ENCOUNTER — Ambulatory Visit: Payer: Self-pay | Admitting: Family Medicine

## 2016-01-21 ENCOUNTER — Telehealth: Payer: Self-pay | Admitting: Family Medicine

## 2016-01-21 NOTE — Telephone Encounter (Signed)
Patient made an appt 01/22/16

## 2016-01-21 NOTE — Telephone Encounter (Signed)
Patient feeling pain behind R eye for "quite a while". She went to her eye doctor, he dilated her eyes & said everything was fine. Please advise.

## 2016-01-21 NOTE — Telephone Encounter (Signed)
Patient will need to schedule an appt for evaluation. We have not seen here for this complaint before.

## 2016-01-22 ENCOUNTER — Ambulatory Visit: Payer: BLUE CROSS/BLUE SHIELD | Admitting: Family Medicine

## 2016-01-25 ENCOUNTER — Encounter: Payer: Self-pay | Admitting: Family Medicine

## 2016-01-25 ENCOUNTER — Ambulatory Visit (INDEPENDENT_AMBULATORY_CARE_PROVIDER_SITE_OTHER): Payer: 59 | Admitting: Family Medicine

## 2016-01-25 VITALS — BP 105/71 | HR 85 | Temp 98.0°F | Resp 20 | Ht 66.0 in | Wt 151.8 lb

## 2016-01-25 DIAGNOSIS — G44209 Tension-type headache, unspecified, not intractable: Secondary | ICD-10-CM | POA: Diagnosis not present

## 2016-01-25 DIAGNOSIS — Z658 Other specified problems related to psychosocial circumstances: Secondary | ICD-10-CM | POA: Diagnosis not present

## 2016-01-25 DIAGNOSIS — F439 Reaction to severe stress, unspecified: Secondary | ICD-10-CM

## 2016-01-25 MED ORDER — BACLOFEN 10 MG PO TABS
10.0000 mg | ORAL_TABLET | Freq: Every evening | ORAL | Status: DC | PRN
Start: 2016-01-25 — End: 2016-05-04

## 2016-01-25 NOTE — Progress Notes (Signed)
Patient ID: Natalie Pitts, female   DOB: April 04, 1971, 45 y.o.   MRN: [id number]    Natalie Pitts , 02-12-1971, 45 y.o., female MRN: [id number]  CC: eye pain Subjective:  Patient presents with complaints of left eye pain. She went to see the eye doctor last week with a normal exam. She states the pain occurs about 4 times a month and can last for 1-4 days. She states the pain occurs in her left eye socket and she also experiences pain left neck area at the same time. She has a history of migraines, but states this is different than her migraines. She reports this has been occuring for a few months. She denies photophobia, nausea, vomit, dizziness or visual changes. She states she takes tylenol and lays down if too bad, and it helps. She endorses increase stress over the last few months. She can not take NSAIDS.  [facility] in [place], Dr. Buford Dresser.   Allergies  Allergen Reactions  . Penicillins Hives  . Celexa [Citalopram Hydrobromide]   . Doxycycline Hyclate Diarrhea  . Prilosec [Omeprazole]    Social History  Substance Use Topics  . Smoking status: Never Smoker   . Smokeless tobacco: Not on file  . Alcohol Use: No   Past Medical History  Diagnosis Date  . Migraine with aura   . GERD (gastroesophageal reflux disease)   . Anxiety    History reviewed. No pertinent past surgical history. Family History  Problem Relation Age of Onset  . Diabetes Mother   . Hyperlipidemia Mother   . Hypertension Mother   . Hypertension Father   . Cancer Father     prostate  . Endometriosis Sister   . Alcohol abuse Brother   . Diabetes Maternal Aunt   . Diabetes Brother      Medication List       This list is accurate as of: 01/25/16 10:58 AM.  Always use your most recent med list.               TUMS E-X PO  Take 1 tablet by mouth daily as needed (nausea and stomach pain).         ROS: Negative, with the exception of above mentioned in HPI   Objective:  BP 105/71  mmHg  Pulse 85  Temp(Src) 98 F (36.7 C)  Resp 20  Ht 5\' 6"  (1.676 m)  Wt 151 lb 12 oz (68.833 kg)  BMI 24.50 kg/m2  SpO2 100% Body mass index is 24.5 kg/(m^2). Gen: Afebrile. No acute distress. Nontoxic in appearance, well developed, well nourished female.  HENT: AT. [place].  MMM, no oral lesions. Bilateral nares WNL.  Eyes:Pupils Equal Round Reactive to light, Extraocular movements intact,  Conjunctiva without redness, discharge or icterus. MSK: No erythema, soft tissue swelling or TTP cervical spine. Muscle tightness bilateral trapezium.  Skin: No rashes, purpura or petechiae. No skin changes,  Neuro: Normal gait. PERLA. EOMi. Alert. Oriented x3    Assessment/Plan: Natalie Pitts is a 45 y.o. female present for OV  Stress/Tension headache - Pt currently without symptoms and normal ophthalmology exam.  - discussed options of massage, heat therapy and Muscle relaxer for her.  This sounds more tension related/atypical migraine. Pt not wanting to start medication if able to avoid.  - Her insurance plan with allow massage for treatment. I think this woul dbe a good option, however she will need to find out more about this benefit so we can refer her to appropriate location.  -  OT/PT/Sports massage every 14 days x 3 treatments.  - baclofen (LIORESAL) 10 MG tablet; Take 1 tablet (10 mg total) by mouth at bedtime as needed for muscle spasms.  Dispense: 30 each; Refill: 0 - OT massage; Future   > 25 minutes spent with patient, >50% of time spent face to face counseling patient and coordinating care.  electronically signed by:  Howard Pouch, DO  [place]

## 2016-01-25 NOTE — Patient Instructions (Signed)
Tension Headache A tension headache is a feeling of pain, pressure, or aching that is often felt over the front and sides of the head. The pain can be dull, or it can feel tight (constricting). Tension headaches are not normally associated with nausea or vomiting, and they do not get worse with physical activity. Tension headaches can last from 30 minutes to several days. This is the most common type of headache. CAUSES The exact cause of this condition is not known. Tension headaches often begin after stress, anxiety, or depression. Other triggers may include:  Alcohol.  Too much caffeine, or caffeine withdrawal.  Respiratory infections, such as colds, flu, or sinus infections.  Dental problems or teeth clenching.  Fatigue.  Holding your head and neck in the same position for a long period of time, such as while using a computer.  Smoking. SYMPTOMS Symptoms of this condition include:  A feeling of pressure around the head.  Dull, aching head pain.  Pain felt over the front and sides of the head.  Tenderness in the muscles of the head, neck, and shoulders. DIAGNOSIS This condition may be diagnosed based on your symptoms and a physical exam. Tests may be done, such as a CT scan or an MRI of your head. These tests may be done if your symptoms are severe or unusual. TREATMENT This condition may be treated with lifestyle changes and medicines to help relieve symptoms. HOME CARE INSTRUCTIONS Managing Pain  Take over-the-counter and prescription medicines only as told by your health care provider.  Lie down in a dark, quiet room when you have a headache.  If directed, apply ice to the head and neck area:  Put ice in a plastic bag.  Place a towel between your skin and the bag.  Leave the ice on for 20 minutes, 2-3 times per day.  Use a heating pad or a hot shower to apply heat to the head and neck area as told by your health care provider. Eating and Drinking  Eat meals on  a regular schedule.  Limit alcohol use.  Decrease your caffeine intake, or stop using caffeine. General Instructions  Keep all follow-up visits as told by your health care provider. This is important.  Keep a headache journal to help find out what may trigger your headaches. For example, write down:  What you eat and drink.  How much sleep you get.  Any change to your diet or medicines.  Try massage or other relaxation techniques.  Limit stress.  Sit up straight, and avoid tensing your muscles.  Do not use tobacco products, including cigarettes, chewing tobacco, or e-cigarettes. If you need help quitting, ask your health care provider.  Exercise regularly as told by your health care provider.  Get 7-9 hours of sleep, or the amount recommended by your health care provider. SEEK MEDICAL CARE IF:  Your symptoms are not helped by medicine.  You have a headache that is different from what you normally experience.  You have nausea or you vomit.  You have a fever. SEEK IMMEDIATE MEDICAL CARE IF:  Your headache becomes severe.  You have repeated vomiting.  You have a stiff neck.  You have a loss of vision.  You have problems with speech.  You have pain in your eye or ear.  You have muscular weakness or loss of muscle control.  You lose your balance or you have trouble walking.  You feel faint or you pass out.  You have confusion.     This information is not intended to replace advice given to you by your health care provider. Make sure you discuss any questions you have with your health care provider.   Document Released: 08/01/2005 Document Revised: 04/22/2015 Document Reviewed: 11/24/2014 Elsevier Interactive Patient Education 2016 Elsevier Inc.  

## 2016-02-04 ENCOUNTER — Telehealth: Payer: Self-pay | Admitting: Family Medicine

## 2016-02-04 DIAGNOSIS — M5481 Occipital neuralgia: Secondary | ICD-10-CM

## 2016-02-04 NOTE — Telephone Encounter (Signed)
Patient is requesting a CB. Her insurance will not coverage message therapy. Patient is thinking she will need a referral for PT for occitital neuralgia. She also saw on the internet that she will need a CT scan or an MRI. I advised patient to contact her insurance company to find out who her preferred provider is so that we know where to send the referral.

## 2016-02-04 NOTE — Telephone Encounter (Signed)
Left message for patient letting her know I will call her back after I speak to Dr Raoul Pitch

## 2016-02-05 DIAGNOSIS — M5481 Occipital neuralgia: Secondary | ICD-10-CM | POA: Insufficient documentation

## 2016-02-05 NOTE — Telephone Encounter (Signed)
Spoke with patient reviewed information . Patient willing to try PT.

## 2016-02-05 NOTE — Telephone Encounter (Signed)
Ok. I will place an order for PT. There is no need for imaging at this time. If she does not respond to treatment will need to see her back and then we will decide on future plan.

## 2016-02-17 ENCOUNTER — Telehealth: Payer: Self-pay | Admitting: Family Medicine

## 2016-02-17 NOTE — Telephone Encounter (Signed)
Patient has received a flyer in the past about a CT Heart Scoring Scan. She no longer has the flyer but called [facility] and found out that they offer the test. Patient is aware this will be an out of pocket expense. Patient requesting referral.

## 2016-02-18 NOTE — Telephone Encounter (Signed)
Spoke with patient reviewed information. Patient will make an appt for CPE.

## 2016-02-18 NOTE — Telephone Encounter (Signed)
Please call pt:  - She is requesting a CT heart score scan order be placed for her. Please explain to pt, this is only ordered for people whom are at elevated risk for heart disease, which has many variables involved in determining, but she currently has none. I would encourage her to make a preventive/CPE visit in which we can screen for heart disease by first starting with cholesterol levels.  This test is not appropriate for screening in low risk patients without indication/symtpoms.

## 2016-02-19 ENCOUNTER — Telehealth: Payer: Self-pay | Admitting: Family Medicine

## 2016-02-19 NOTE — Telephone Encounter (Signed)
Patient is calling to report she is still experiencing severe neck pain.  It has been worse following physical therapy.  Patient is asking if she should come in to see Dr. Raoul Pitch for further evaluation or if she should be referred to specialist.  Please follow up with patient.

## 2016-02-19 NOTE — Telephone Encounter (Signed)
Spoke with patient reviewed instructions. Patient states she will call back to schedule an appt.

## 2016-02-19 NOTE — Telephone Encounter (Signed)
Please advise 

## 2016-02-19 NOTE — Telephone Encounter (Signed)
Pt will need to be seen for this issue and referred if appropriate. In the meantime I would discourage her from continuing PT if it is making her symptoms worse.

## 2016-02-22 ENCOUNTER — Encounter: Payer: Self-pay | Admitting: Family Medicine

## 2016-02-22 ENCOUNTER — Ambulatory Visit (HOSPITAL_BASED_OUTPATIENT_CLINIC_OR_DEPARTMENT_OTHER)
Admission: RE | Admit: 2016-02-22 | Discharge: 2016-02-22 | Disposition: A | Discharge status: Home / Self Care | Payer: 59 | Source: Ambulatory Visit | Attending: Family Medicine | Admitting: Family Medicine

## 2016-02-22 ENCOUNTER — Ambulatory Visit (INDEPENDENT_AMBULATORY_CARE_PROVIDER_SITE_OTHER): Payer: 59 | Admitting: Family Medicine

## 2016-02-22 VITALS — BP 114/77 | HR 78 | Temp 98.0°F | Resp 20 | Ht 66.0 in | Wt 154.0 lb

## 2016-02-22 DIAGNOSIS — M5382 Other specified dorsopathies, cervical region: Secondary | ICD-10-CM | POA: Diagnosis not present

## 2016-02-22 DIAGNOSIS — M542 Cervicalgia: Secondary | ICD-10-CM

## 2016-02-22 NOTE — Progress Notes (Signed)
Patient ID: Natalie Pitts, female   DOB: Oct 17, 1970, 45 y.o.   MRN: [id number]    Natalie Pitts , August 29, 1970, 45 y.o., female MRN: [id number]  CC: Right neck pain Subjective:  Patient presents for right neck pain. She has been seen recently for left neck and left eye pain consistent with migraine vs neuralgia. Today she states it is a different pain than prior. She has not felt the muscle relaxer or massage has helped this pain, so she stopped taking the medication. She stopped going to the massage because she felt it was making it worse. She reports this "sensation" has been present intermittently for 3 weeks or more.  She describes the sensation as the feeling a muscle cramps has just when its about to start, but does not go into the cramp. She is unable to tolerate NSAIDS.   Allergies  Allergen Reactions  . Penicillins Hives  . Celexa [Citalopram Hydrobromide]   . Doxycycline Hyclate Diarrhea  . Prilosec [Omeprazole]    Social History  Substance Use Topics  . Smoking status: Never Smoker   . Smokeless tobacco: Not on file  . Alcohol Use: No   Past Medical History  Diagnosis Date  . Migraine with aura   . GERD (gastroesophageal reflux disease)   . Anxiety    History reviewed. No pertinent past surgical history. Family History  Problem Relation Age of Onset  . Diabetes Mother   . Hyperlipidemia Mother   . Hypertension Mother   . Hypertension Father   . Cancer Father     prostate  . Endometriosis Sister   . Alcohol abuse Brother   . Diabetes Maternal Aunt   . Diabetes Brother      Medication List       This list is accurate as of: 02/22/16  3:39 PM.  Always use your most recent med list.               baclofen 10 MG tablet  Commonly known as:  LIORESAL  Take 1 tablet (10 mg total) by mouth at bedtime as needed for muscle spasms.     TUMS E-X PO  Take 1 tablet by mouth daily as needed (nausea and stomach pain).        ROS: Negative, with the exception  of above mentioned in HPI  Objective:  BP 114/77 mmHg  Pulse 78  Temp(Src) 98 F (36.7 C) (Oral)  Resp 20  Ht 5\' 6"  (1.676 m)  Wt 154 lb (69.854 kg)  BMI 24.87 kg/m2  SpO2 97% Body mass index is 24.87 kg/(m^2). Gen: Afebrile. No acute distress. Nontoxic in appearance, well developed, well nourished female.  HENT: AT. [place].  MMM, no oral lesions.  Eyes:Pupils Equal Round Reactive to light, Extraocular movements intact,  Conjunctiva without redness, discharge or icterus. MSK: No erythema, soft tissue swelling or TTP cervical spine. Muscle tightness right trapezium and right SCM. Mild hypertrophy right SCM. Pts neutral position is mild right SB.  Skin: No rashes, purpura or petechiae. No skin changes,  Neuro: Normal gait. PERLA. EOMi. Alert. Oriented x3    Assessment/Plan: Natalie Pitts is a 45 y.o. female present for OV  Cervical pain (neck) - right SCM with mild hypertrophy ? Early torticollis. Pt has cervical neck discomfort as well, will image today.  - continue muscle relaxer, heat therapy and stretches.  - consider CT soft tissue neck.  - DG Cervical Spine Complete; Future - Pt to stop massage therapy since that seemed to have no  benefit and worsened.  - F/U dependent on xray, pt has CPE appt next week.    > 25 minutes spent with patient, >50% of time spent face to face counseling patient and coordinating care.  electronically signed by:  Howard Pouch, DO  [place]

## 2016-02-22 NOTE — Patient Instructions (Signed)
Acute Torticollis Torticollis is a condition in which the muscles of the neck tighten (contract) abnormally, causing the neck to twist and the head to move into an unnatural position. Torticollis that develops suddenly is called acute torticollis. If torticollis becomes chronic and is left untreated, the face and neck can become deformed. CAUSES This condition may be caused by:  Sleeping in an awkward position (common).  Extending or twisting the neck muscles beyond their normal position.  Infection. In some cases, the cause may not be known. SYMPTOMS Symptoms of this condition include:  An unnatural position of the head.  Neck pain.  A limited ability to move the neck.  Twisting of the neck to one side. DIAGNOSIS This condition is diagnosed with a physical exam. You may also have imaging tests, such as an X-ray, CT scan, or MRI. TREATMENT Treatment for this condition involves trying to relax the neck muscles. It may include:  Medicines or shots.  Physical therapy.  Surgery. This may be done in severe cases. HOME CARE INSTRUCTIONS  Take medicines only as directed by your health care provider.  Do stretching exercises and massage your neck as directed by your health care provider.  Keep all follow-up visits as directed by your health care provider. This is important.    This information is not intended to replace advice given to you by your health care provider. Make sure you discuss any questions you have with your health care provider.   Document Released: 07/29/2000 Document Revised: 12/16/2014 Document Reviewed: 07/28/2014 Elsevier Interactive Patient Education [employer].  Please have xray completed. If needed will order additional studies or refer you.

## 2016-02-23 ENCOUNTER — Telehealth: Payer: Self-pay | Admitting: Family Medicine

## 2016-02-23 NOTE — Telephone Encounter (Signed)
Spoke with patient reviewed xray results ,instructions and information. Patient verbalized understanding. 

## 2016-02-23 NOTE — Telephone Encounter (Signed)
Please call pt: - her xray is normal.  - will consider referral if it does not improve with stretches/muscle relaxer. - can discuss at appt next week if still present.

## 2016-03-01 ENCOUNTER — Encounter: Payer: 59 | Admitting: Family Medicine

## 2016-03-02 ENCOUNTER — Ambulatory Visit (INDEPENDENT_AMBULATORY_CARE_PROVIDER_SITE_OTHER): Payer: 59 | Admitting: Family Medicine

## 2016-03-02 ENCOUNTER — Encounter: Payer: Self-pay | Admitting: Family Medicine

## 2016-03-02 DIAGNOSIS — Z13220 Encounter for screening for lipoid disorders: Secondary | ICD-10-CM | POA: Diagnosis not present

## 2016-03-02 DIAGNOSIS — F4010 Social phobia, unspecified: Secondary | ICD-10-CM | POA: Insufficient documentation

## 2016-03-02 DIAGNOSIS — Z130 Encounter for screening for diseases of the blood and blood-forming organs and certain disorders involving the immune mechanism: Secondary | ICD-10-CM

## 2016-03-02 DIAGNOSIS — Z23 Encounter for immunization: Secondary | ICD-10-CM | POA: Diagnosis not present

## 2016-03-02 DIAGNOSIS — Z114 Encounter for screening for human immunodeficiency virus [HIV]: Secondary | ICD-10-CM | POA: Diagnosis not present

## 2016-03-02 DIAGNOSIS — Z0000 Encounter for general adult medical examination without abnormal findings: Secondary | ICD-10-CM | POA: Diagnosis not present

## 2016-03-02 LAB — COMPREHENSIVE METABOLIC PANEL
ALT: 14 U/L (ref 0–35)
AST: 16 U/L (ref 0–37)
Albumin: 4.2 g/dL (ref 3.5–5.2)
Alkaline Phosphatase: 51 U/L (ref 39–117)
BILIRUBIN TOTAL: 0.4 mg/dL (ref 0.2–1.2)
BUN: 9 mg/dL (ref 6–23)
CALCIUM: 9.4 mg/dL (ref 8.4–10.5)
CHLORIDE: 104 meq/L (ref 96–112)
CO2: 29 meq/L (ref 19–32)
Creatinine, Ser: 0.83 mg/dL (ref 0.40–1.20)
GFR: 79.45 mL/min (ref >60.00)
Glucose, Bld: 87 mg/dL (ref 70–99)
Potassium: 4.6 mEq/L (ref 3.5–5.1)
Sodium: 140 mEq/L (ref 135–145)
Total Protein: 6.9 g/dL (ref 6.0–8.3)

## 2016-03-02 LAB — CBC WITH DIFFERENTIAL/PLATELET
BASOS ABS: 0 10*3/uL (ref 0.0–0.1)
BASOS PCT: 0.5 % (ref 0.0–3.0)
EOS ABS: 0.1 10*3/uL (ref 0.0–0.7)
Eosinophils Relative: 1.9 % (ref 0.0–5.0)
HEMATOCRIT: 39.4 % (ref 36.0–46.0)
Hemoglobin: 13 g/dL (ref 12.0–15.0)
LYMPHS PCT: 28.9 % (ref 12.0–46.0)
Lymphs Abs: 1.7 10*3/uL (ref 0.7–4.0)
MCHC: 33.1 g/dL (ref 30.0–36.0)
MCV: 83.4 fl (ref 78.0–100.0)
MONO ABS: 0.4 10*3/uL (ref 0.1–1.0)
Monocytes Relative: 6.5 % (ref 3.0–12.0)
NEUTROS ABS: 3.7 10*3/uL (ref 1.4–7.7)
Neutrophils Relative %: 62.2 % (ref 43.0–77.0)
PLATELETS: 256 10*3/uL (ref 150.0–400.0)
RBC: 4.72 Mil/uL (ref 3.87–5.11)
RDW: 13.7 % (ref 11.5–15.5)
WBC: 5.9 10*3/uL (ref 4.0–10.5)

## 2016-03-02 LAB — LIPID PANEL
CHOL/HDL RATIO: 4
Cholesterol: 222 mg/dL — ABNORMAL HIGH (ref 0–200)
HDL: 52.3 mg/dL (ref >39.00)
LDL CALC: 141 mg/dL — AB (ref 0–99)
NonHDL: 169.5
TRIGLYCERIDES: 145 mg/dL (ref 0.0–149.0)
VLDL: 29 mg/dL (ref 0.0–40.0)

## 2016-03-02 NOTE — Patient Instructions (Addendum)
Health Maintenance, Female Adopting a healthy lifestyle and getting preventive care can go a long way to promote health and wellness. Talk with your health care provider about what schedule of regular examinations is right for you. This is a good chance for you to check in with your provider about disease prevention and staying healthy. In between checkups, there are plenty of things you can do on your own. Experts have done a lot of research about which lifestyle changes and preventive measures are most likely to keep you healthy. Ask your health care provider for more information. WEIGHT AND DIET  Eat a healthy diet  Be sure to include plenty of vegetables, fruits, low-fat dairy products, and lean protein.  Do not eat a lot of foods high in solid fats, added sugars, or salt.  Get regular exercise. This is one of the most important things you can do for your health.  Most adults should exercise for at least 150 minutes each week. The exercise should increase your heart rate and make you sweat (moderate-intensity exercise).  Most adults should also do strengthening exercises at least twice a week. This is in addition to the moderate-intensity exercise.  Maintain a healthy weight  Body mass index (BMI) is a measurement that can be used to identify possible weight problems. It estimates body fat based on height and weight. Your health care provider can help determine your BMI and help you achieve or maintain a healthy weight.  For females 20 years of age and older:   A BMI below 18.5 is considered underweight.  A BMI of 18.5 to 24.9 is normal.  A BMI of 25 to 29.9 is considered overweight.  A BMI of 30 and above is considered obese.  Watch levels of cholesterol and blood lipids  You should start having your blood tested for lipids and cholesterol at 44 years of age, then have this test every 5 years.  You may need to have your cholesterol levels checked more often if:  Your lipid  or cholesterol levels are high.  You are older than 44 years of age.  You are at high risk for heart disease.  CANCER SCREENING   Lung Cancer  Lung cancer screening is recommended for adults 55-80 years old who are at high risk for lung cancer because of a history of smoking.  A yearly low-dose CT scan of the lungs is recommended for people who:  Currently smoke.  Have quit within the past 15 years.  Have at least a 30-pack-year history of smoking. A pack year is smoking an average of one pack of cigarettes a day for 1 year.  Yearly screening should continue until it has been 15 years since you quit.  Yearly screening should stop if you develop a health problem that would prevent you from having lung cancer treatment.  Breast Cancer  Practice breast self-awareness. This means understanding how your breasts normally appear and feel.  It also means doing regular breast self-exams. Let your health care provider know about any changes, no matter how small.  If you are in your 20s or 30s, you should have a clinical breast exam (CBE) by a health care provider every 1-3 years as part of a regular health exam.  If you are 40 or older, have a CBE every year. Also consider having a breast X-ray (mammogram) every year.  If you have a family history of breast cancer, talk to your health care provider about genetic screening.  If you   are at high risk for breast cancer, talk to your health care provider about having an MRI and a mammogram every year.  Breast cancer gene (BRCA) assessment is recommended for women who have family members with BRCA-related cancers. BRCA-related cancers include:  Breast.  Ovarian.  Tubal.  Peritoneal cancers.  Results of the assessment will determine the need for genetic counseling and BRCA1 and BRCA2 testing. Cervical Cancer Your health care provider may recommend that you be screened regularly for cancer of the pelvic organs (ovaries, uterus, and  vagina). This screening involves a pelvic examination, including checking for microscopic changes to the surface of your cervix (Pap test). You may be encouraged to have this screening done every 3 years, beginning at age 21.  For women ages 30-65, health care providers may recommend pelvic exams and Pap testing every 3 years, or they may recommend the Pap and pelvic exam, combined with testing for human papilloma virus (HPV), every 5 years. Some types of HPV increase your risk of cervical cancer. Testing for HPV may also be done on women of any age with unclear Pap test results.  Other health care providers may not recommend any screening for nonpregnant women who are considered low risk for pelvic cancer and who do not have symptoms. Ask your health care provider if a screening pelvic exam is right for you.  If you have had past treatment for cervical cancer or a condition that could lead to cancer, you need Pap tests and screening for cancer for at least 20 years after your treatment. If Pap tests have been discontinued, your risk factors (such as having a new sexual partner) need to be reassessed to determine if screening should resume. Some women have medical problems that increase the chance of getting cervical cancer. In these cases, your health care provider may recommend more frequent screening and Pap tests. Colorectal Cancer  This type of cancer can be detected and often prevented.  Routine colorectal cancer screening usually begins at 44 years of age and continues through 44 years of age.  Your health care provider may recommend screening at an earlier age if you have risk factors for colon cancer.  Your health care provider may also recommend using home test kits to check for hidden blood in the stool.  A small camera at the end of a tube can be used to examine your colon directly (sigmoidoscopy or colonoscopy). This is done to check for the earliest forms of colorectal  cancer.  Routine screening usually begins at age 50.  Direct examination of the colon should be repeated every 5-10 years through 44 years of age. However, you may need to be screened more often if early forms of precancerous polyps or small growths are found. Skin Cancer  Check your skin from head to toe regularly.  Tell your health care provider about any new moles or changes in moles, especially if there is a change in a mole's shape or color.  Also tell your health care provider if you have a mole that is larger than the size of a pencil eraser.  Always use sunscreen. Apply sunscreen liberally and repeatedly throughout the day.  Protect yourself by wearing long sleeves, pants, a wide-brimmed hat, and sunglasses whenever you are outside. HEART DISEASE, DIABETES, AND HIGH BLOOD PRESSURE   High blood pressure causes heart disease and increases the risk of stroke. High blood pressure is more likely to develop in:  People who have blood pressure in the high end   of the normal range (130-139/85-89 mm Hg).  People who are overweight or obese.  People who are African American.  If you are 38-23 years of age, have your blood pressure checked every 3-5 years. If you are 61 years of age or older, have your blood pressure checked every year. You should have your blood pressure measured twice--once when you are at a hospital or clinic, and once when you are not at a hospital or clinic. Record the average of the two measurements. To check your blood pressure when you are not at a hospital or clinic, you can use:  An automated blood pressure machine at a pharmacy.  A home blood pressure monitor.  If you are between 45 years and 39 years old, ask your health care provider if you should take aspirin to prevent strokes.  Have regular diabetes screenings. This involves taking a blood sample to check your fasting blood sugar level.  If you are at a normal weight and have a low risk for diabetes,  have this test once every three years after 44 years of age.  If you are overweight and have a high risk for diabetes, consider being tested at a younger age or more often. PREVENTING INFECTION  Hepatitis B  If you have a higher risk for hepatitis B, you should be screened for this virus. You are considered at high risk for hepatitis B if:  You were born in a country where hepatitis B is common. Ask your health care provider which countries are considered high risk.  Your parents were born in a high-risk country, and you have not been immunized against hepatitis B (hepatitis B vaccine).  You have HIV or AIDS.  You use needles to inject street drugs.  You live with someone who has hepatitis B.  You have had sex with someone who has hepatitis B.  You get hemodialysis treatment.  You take certain medicines for conditions, including cancer, organ transplantation, and autoimmune conditions. Hepatitis C  Blood testing is recommended for:  Everyone born from [birth year] through 1964.  Anyone with known risk factors for hepatitis C. Sexually transmitted infections (STIs)  You should be screened for sexually transmitted infections (STIs) including gonorrhea and chlamydia if:  You are sexually active and are younger than 44 years of age.  You are older than 44 years of age and your health care provider tells you that you are at risk for this type of infection.  Your sexual activity has changed since you were last screened and you are at an increased risk for chlamydia or gonorrhea. Ask your health care provider if you are at risk.  If you do not have HIV, but are at risk, it may be recommended that you take a prescription medicine daily to prevent HIV infection. This is called pre-exposure prophylaxis (PrEP). You are considered at risk if:  You are sexually active and do not regularly use condoms or know the HIV status of your partner(s).  You take drugs by injection.  You are sexually  active with a partner who has HIV. Talk with your health care provider about whether you are at high risk of being infected with HIV. If you choose to begin PrEP, you should first be tested for HIV. You should then be tested every 3 months for as long as you are taking PrEP.  PREGNANCY   If you are premenopausal and you may become pregnant, ask your health care provider about preconception counseling.  If you may  become pregnant, take 400 to 800 micrograms (mcg) of folic acid every day.  If you want to prevent pregnancy, talk to your health care provider about birth control (contraception). OSTEOPOROSIS AND MENOPAUSE   Osteoporosis is a disease in which the bones lose minerals and strength with aging. This can result in serious bone fractures. Your risk for osteoporosis can be identified using a bone density scan.  If you are 56 years of age or older, or if you are at risk for osteoporosis and fractures, ask your health care provider if you should be screened.  Ask your health care provider whether you should take a calcium or vitamin D supplement to lower your risk for osteoporosis.  Menopause may have certain physical symptoms and risks.  Hormone replacement therapy may reduce some of these symptoms and risks. Talk to your health care provider about whether hormone replacement therapy is right for you.  HOME CARE INSTRUCTIONS   Schedule regular health, dental, and eye exams.  Stay current with your immunizations.   Do not use any tobacco products including cigarettes, chewing tobacco, or electronic cigarettes.  If you are pregnant, do not drink alcohol.  If you are breastfeeding, limit how much and how often you drink alcohol.  Limit alcohol intake to no more than 1 drink per day for nonpregnant women. One drink equals 12 ounces of beer, 5 ounces of wine, or 1 ounces of hard liquor.  Do not use street drugs.  Do not share needles.  Ask your health care provider for help if  you need support or information about quitting drugs.  Tell your health care provider if you often feel depressed.  Tell your health care provider if you have ever been abused or do not feel safe at home.   This information is not intended to replace advice given to you by your health care provider. Make sure you discuss any questions you have with your health care provider.   Document Released: 02/14/2011 Document Revised: 08/22/2014 Document Reviewed: 07/03/2013 Elsevier Interactive Patient Education 2016 [employer]  Calcium 1000 mg daily, Vitamin D 600-800u day. B12 is ok to take daily

## 2016-03-02 NOTE — Progress Notes (Signed)
Patient ID: Marguerite Olea, female   DOB: [birth year], 45 y.o.   MRN: [id number]     Patient ID: Sahej Holling, female  DOB: 04-May-1971, 45 y.o.   MRN: [id number] Patient Care Team    Relationship Specialty Notifications Start End  Ma Hillock, DO PCP - General Family Medicine  09/23/15   Philomena Doheny, MD Referring Physician Nurse Practitioner  03/02/16     Subjective:  Rayhanna Lolley is a 45 y.o.  Female  present for CPE. All past medical history, surgical history, allergies, family history, immunizations, medications and social history were updated in the electronic medical record today. All recent labs, ED visits and hospitalizations within the last year were reviewed.  Social phobia: She made an appt for counseling for her social phobia at [facility]. She is having difficulty eating in public. This is causing marital problems. She is not interested in medications at this time, but may decide to start a medication after therapy.    Health maintenance:  Colonoscopy: no fhx, screen 50 Mammogram: completed:05/29/2015, birads 1.  Cervical cancer screening: last pap: 2016, results: "normal", completed by: Cox, FNP Immunizations: tdap administered today, Influenza  (encouraged yearly) Infectious disease screening: HIV agreeable to screening DEXA: screen at 60 Assistive device: none Oxygen [id number] Patient has a Dental home. Routine screening recommended.  Hospitalizations/ED visits: none  Depression screen [facility] 2/9 03/02/2016  Decreased Interest 3  Down, Depressed, Hopeless 1  PHQ - 2 Score 4  Altered sleeping 1  Tired, decreased energy 0  Change in appetite 0  Feeling bad or failure about yourself  1  Trouble concentrating 1  Moving slowly or fidgety/restless 1  Suicidal thoughts 0  PHQ-9 Score 8    Fall Risk  03/02/2016  Falls in the past year? No   Current Exercise Habits: The patient does not participate in regular exercise at present     Immunization  History  Administered Date(s) Administered  . Influenza,inj,Quad PF,36+ Mos 05/06/2014     Past Medical History  Diagnosis Date  . Migraine with aura   . GERD (gastroesophageal reflux disease)   . Anxiety    Allergies  Allergen Reactions  . Penicillins Hives  . Celexa [Citalopram Hydrobromide]   . Doxycycline Hyclate Diarrhea  . Prilosec [Omeprazole]    History reviewed. No pertinent past surgical history. Family History  Problem Relation Age of Onset  . Diabetes Mother   . Hyperlipidemia Mother   . Hypertension Mother   . Hypertension Father   . Cancer Father     prostate  . Endometriosis Sister   . Alcohol abuse Brother   . Diabetes Maternal Aunt   . Diabetes Brother    Social History   Social History  . Marital Status: Married    Spouse Name: N/A  . Number of Children: 4  . Years of Education: N/A   Occupational History  .      homemaker   Social History Main Topics  . Smoking status: Never Smoker   . Smokeless tobacco: Not on file  . Alcohol Use: No  . Drug Use: No  . Sexual Activity: Yes   Other Topics Concern  . Not on file   Social History Narrative   Ms. Bores lives with her husband & two younger children. @ older children live in [place]. She relocated to [facility] recently. She is a Clinical biochemist.     Medication List       This list is accurate as  of: 03/02/16  8:11 AM.  Always use your most recent med list.               baclofen 10 MG tablet  Commonly known as:  LIORESAL  Take 1 tablet (10 mg total) by mouth at bedtime as needed for muscle spasms.     TUMS E-X PO  Take 1 tablet by mouth daily as needed (nausea and stomach pain).        No results found for this or any previous visit (from the past 2160 hour(s)).  Dg Cervical Spine Complete  02/23/2016  CLINICAL DATA:  46 year old female with a history of right-sided neck pain EXAM: CERVICAL SPINE - COMPLETE 4+ VIEW COMPARISON:  None. FINDINGS: Cervical Spine: Cervical  elements from the level of the C1-T1 maintain alignment, without subluxation, anterolisthesis, retrolisthesis. No acute fracture line identified. Unremarkable appearance of the craniocervical junction. Vertebral body heights maintained as well as disc space heights. No significant degenerative disc disease or endplate changes. No significant facet disease. Prevertebral soft tissues within normal limits. Open mouth odontoid view unremarkable. Oblique images demonstrate no significant foraminal encroachment. IMPRESSION: Negative for acute fracture or malalignment of the cervical spine. Signed, Dulcy Fanny. Earleen Newport, DO [facility]iology Electronically Signed   By: Corrie Mckusick D.O.   On: 02/23/2016 09:06    ROS: 14 pt review of systems performed and negative (unless mentioned in an HPI)  Objective: BP 102/71 mmHg  Pulse 66  Temp(Src) 98.4 F (36.9 C)  Resp 20  Ht 5\' 6"  (1.676 m)  Wt 154 lb 12 oz (70.194 kg)  BMI 24.99 kg/m2  SpO2 97%  LMP 02/25/2016 Gen: Afebrile. No acute distress. Nontoxic in appearance, well-developed, well-nourished,  Pleasant caucasian female.  HENT: AT. [place]. Bilateral TM visualized and normal in appearance, normal external auditory canal. MMM, no oral lesions, adequate dentition. Bilateral nares within normal limits wnl. Throat without erythema, ulcerations or exudates. no Cough on exam, no hoarseness on exam. Eyes:Pupils Equal Round Reactive to light, Extraocular movements intact,  Conjunctiva without redness, discharge or icterus. Neck/lymp/endocrine: Supple,no lymphadenopathy, no thyromegaly CV: RRR no murmur, no edema, +2/4 P posterior tibialis pulses. no carotid bruits. No JVD. Chest: CTAB, no wheeze, rhonchi or crackles. Normal  Respiratory effort. good Air movement. Abd: Soft. flat. NTND. BS present. no Masses palpated. No hepatosplenomegaly. No rebound tenderness or guarding. Skin: no rashes, purpura or petechiae. Warm  and well-perfused. Skin intact. Neuro/Msk:  Normal gait. PERLA. EOMi. Alert. Oriented x3.  Cranial nerves II through XII intact. Muscle strength 5/5 upper/lower extremity. DTRs equal bilaterally. Psych: mild anxiousness. Otherwise, Normal affect, dress and demeanor. Normal speech. Normal thought content and judgment.  Assessment/plan: Jeneva Dragovich is a 45 y.o. female present for CPE without PAP.   Encounter for preventive health examination Patient was encouraged to exercise greater than 150 minutes a week. Patient was encouraged to choose a diet filled with fresh fruits and vegetables, and lean meats. - CBC w/Diff - Comprehensive metabolic panel - Lipid panel - HIV antibody (with reflex)--> pt amendable to testing today - PREP program information given to patient.  - tdap administered today.  - social phobia: seeing therapist.  - encouraged routine f/u with dentist.   AVS provided to patient today for education/recommendation on gender specific health and safety maintenance.  1 year CPE Electronically signed by: Howard Pouch, DO [place]

## 2016-03-03 ENCOUNTER — Telehealth: Payer: Self-pay | Admitting: Family Medicine

## 2016-03-03 DIAGNOSIS — H6122 Impacted cerumen, left ear: Secondary | ICD-10-CM

## 2016-03-03 DIAGNOSIS — H6120 Impacted cerumen, unspecified ear: Secondary | ICD-10-CM | POA: Insufficient documentation

## 2016-03-03 LAB — HIV ANTIBODY (ROUTINE TESTING W REFLEX): HIV: NONREACTIVE

## 2016-03-03 MED ORDER — CARBAMIDE PEROXIDE 6.5 % OT SOLN: 5.0000 [drp] | Freq: Two times a day (BID) | OTIC | Status: DC

## 2016-03-03 NOTE — Telephone Encounter (Signed)
Left message with results and detailed instructions on patient voice mail per DPR.

## 2016-03-03 NOTE — Telephone Encounter (Signed)
Please call pt: - Her labs are all normal. Her cholesterol is very mildly elevated. It is not at the level we would recommend prescribing medications, but I would recommend she start fish oil supplement if able to tolerate 1-2 g daily, exercises greater than 150 minutes a week, and eat a diet low in saturated fats, sugar. - I also forgot to call in the ear solution yesterday, so I called it in today.

## 2016-05-04 ENCOUNTER — Ambulatory Visit (INDEPENDENT_AMBULATORY_CARE_PROVIDER_SITE_OTHER): Payer: 59 | Admitting: Family Medicine

## 2016-05-04 ENCOUNTER — Encounter: Payer: Self-pay | Admitting: Family Medicine

## 2016-05-04 VITALS — BP 110/73 | HR 71 | Temp 98.2°F | Resp 20 | Wt 156.5 lb

## 2016-05-04 DIAGNOSIS — J358 Other chronic diseases of tonsils and adenoids: Secondary | ICD-10-CM

## 2016-05-04 DIAGNOSIS — J029 Acute pharyngitis, unspecified: Secondary | ICD-10-CM | POA: Diagnosis not present

## 2016-05-04 LAB — POCT RAPID STREP A (OFFICE): Rapid Strep A Screen: NEGATIVE

## 2016-05-04 MED ORDER — CHLORHEXIDINE GLUCONATE 0.12 % MT SOLN: 15.0000 mL | Freq: Two times a day (BID) | OROMUCOSAL | 0 refills | Status: DC

## 2016-05-04 NOTE — Patient Instructions (Signed)
Use mouth wash a few times a day for about a week as directed on the bottle. Gargle with the mouth wash to cleanse tonsil area.   This does not appear to be infectious cause.

## 2016-05-04 NOTE — Progress Notes (Signed)
Natalie Pitts , [birth year], 45 y.o., female MRN: [id number] Patient Care Team    Relationship Specialty Notifications Start End  Ma Hillock, DO PCP - General Family Medicine  09/23/15   Philomena Doheny, MD Referring Physician Nurse Practitioner  03/02/16    Comment: gynecology    CC: White patches in throat Subjective: Pt presents for an acute OV with complaints of white patches in her throat for a few days duration.  Associated symptoms include PND, Something stuck in her throat sensation. Patient denies fever, chills, nausea, vomit, fatigue or diarrhea. She states she does not feel sick.  Pt feels symptoms are worse when laying flat, she does have acid reflux, seasonal allergies and palpitation history.  Pt has tried OTC antihistamine and removing the area with a q-tip, to ease their symptoms.  She has a h/o tonsillar stones and removes them with q-tips, this time she felt there were too much "stuff" to remove.   Allergies  Allergen Reactions  . Penicillins Hives  . Celexa [Citalopram Hydrobromide]   . Doxycycline Hyclate Diarrhea  . Prilosec [Omeprazole]    Social History  Substance Use Topics  . Smoking status: Never Smoker  . Smokeless tobacco: Never Used  . Alcohol use No   Past Medical History:  Diagnosis Date  . Anxiety   . GERD (gastroesophageal reflux disease)   . Migraine with aura    History reviewed. No pertinent surgical history. Family History  Problem Relation Age of Onset  . Diabetes Mother   . Hyperlipidemia Mother   . Hypertension Mother   . Hypertension Father   . Cancer Father     prostate  . Endometriosis Sister   . Alcohol abuse Brother   . Diabetes Maternal Aunt   . Diabetes Brother      Medication List    as of 05/04/2016  9:18 AM   You have not been prescribed any medications.     No results found for this or any previous visit (from the past 24 hour(s)). No results found.   ROS: Negative, with the exception of above  mentioned in HPI   Objective:  BP 110/73 (BP Location: Right Arm, Patient Position: Sitting, Cuff Size: Normal)   Pulse 71   Temp 98.2 F (36.8 C)   Resp 20   Wt 156 lb 8 oz (71 kg)   BMI 25.26 kg/m  Body mass index is 25.26 kg/m. Gen: Afebrile. No acute distress. Nontoxic in appearance, well developed, well nourished.  HENT: AT. [place]. Bilateral TM visualized without erythema or bulging. MMM, no oral lesions. Bilateral nares without erythema or swelling. Throat without erythema or exudates. Small white patches on tonsils bilateral. No tonsillar enlargement or redness.  Eyes:Pupils Equal Round Reactive to light, Extraocular movements intact,  Conjunctiva without redness, discharge or icterus. Neck/lymp/endocrine: Supple, No lymphadenopathy CV: RRR  Chest: CTAB, no wheeze or crackles. Skin: no rashes, purpura or petechiae.  Neuro: Normal gait. PERLA. EOMi. Alert. Oriented x3   Assessment/Plan: Natalie Pitts is a 45 y.o. female present for acute OV for  Throat soreness/Symptomatic tonsillar crypt - POCT rapid strep A - chlorhexidine (PERIDEX) 0.12 % solution; Use as directed 15 mLs in the mouth or throat 2 (two) times daily.  Dispense: 120 mL; Refill: 0 - discussed cryptic tonsils. Encouraged good oral hygiene and use of peridex.  - would consider ENT referral if worsening.  - F/U PRN    electronically signed by:  Howard Pouch, DO  [facility] [place]

## 2016-05-31 ENCOUNTER — Encounter: Payer: Self-pay | Admitting: Family Medicine

## 2016-05-31 ENCOUNTER — Ambulatory Visit (INDEPENDENT_AMBULATORY_CARE_PROVIDER_SITE_OTHER): Payer: 59 | Admitting: Family Medicine

## 2016-05-31 VITALS — BP 107/74 | HR 79 | Temp 98.0°F | Resp 20 | Wt 158.5 lb

## 2016-05-31 DIAGNOSIS — R109 Unspecified abdominal pain: Secondary | ICD-10-CM

## 2016-05-31 DIAGNOSIS — M545 Low back pain, unspecified: Secondary | ICD-10-CM

## 2016-05-31 LAB — POC URINALSYSI DIPSTICK (AUTOMATED)
Bilirubin, UA: NEGATIVE
Blood, UA: NEGATIVE
Glucose, UA: NEGATIVE
Ketones, UA: NEGATIVE
Leukocytes, UA: NEGATIVE
NITRITE UA: NEGATIVE
Protein, UA: NEGATIVE
Spec Grav, UA: 1.025
UROBILINOGEN UA: 1
pH, UA: 6.5

## 2016-05-31 NOTE — Progress Notes (Signed)
Natalie Pitts , [birth year], 45 y.o., female MRN: [id number] Patient Care Team    Relationship Specialty Notifications Start End  Ma Hillock, DO PCP - General Family Medicine  09/23/15   Philomena Doheny, MD Referring Physician Nurse Practitioner  03/02/16    Comment: gynecology    CC: low back pain  Subjective: Pt presents for an acute OV with complaints of low back  of 10 days duration. She does not recall injury or increase in activity.  Associated symptoms include  Left sided lumbar pain.  Worse at night and sometimes with twisting or sitting. She reports the pain is throbbing in nature when it occurs. She has tried heat, tylenol and massage. The massage was uncomfortable. She has rested, been off work for a few days. She has been walking/light jogging a few times a week. She works on her feet and pushes clothing racks. She denies radiation of pain. She denies prior h/o of injury or trauma. She denies fever, chills, dysuria or urinary frequency. She is worried it is a tumor, because her aunt had one in her back that needed surgery.   Allergies  Allergen Reactions  . Penicillins Hives  . Celexa [Citalopram Hydrobromide]   . Doxycycline Hyclate Diarrhea  . Prilosec [Omeprazole]    Social History  Substance Use Topics  . Smoking status: Never Smoker  . Smokeless tobacco: Never Used  . Alcohol use No   Past Medical History:  Diagnosis Date  . Anxiety   . GERD (gastroesophageal reflux disease)   . Migraine with aura    History reviewed. No pertinent surgical history. Family History  Problem Relation Age of Onset  . Diabetes Mother   . Hyperlipidemia Mother   . Hypertension Mother   . Hypertension Father   . Cancer Father     prostate  . Endometriosis Sister   . Alcohol abuse Brother   . Diabetes Maternal Aunt   . Diabetes Brother      Medication List       Accurate as of 05/31/16 10:06 AM. Always use your most recent med list.          acetaminophen 500 MG  tablet Commonly known as:  TYLENOL Take 500 mg by mouth every 6 (six) hours as needed.       No results found for this or any previous visit (from the past 24 hour(s)). No results found.   ROS: Negative, with the exception of above mentioned in HPI   Objective:  BP 107/74 (BP Location: Left Arm, Patient Position: Sitting, Cuff Size: Normal)   Pulse 79   Temp 98 F (36.7 C)   Resp 20   Wt 158 lb 8 oz (71.9 kg)   SpO2 99%   BMI 25.58 kg/m  Body mass index is 25.58 kg/m. Gen: Afebrile. No acute distress. Nontoxic in appearance, well developed, well nourished.  HENT: AT. [place].  MMM Eyes:Pupils Equal Round Reactive to light, Extraocular movements intact,  Conjunctiva without redness, discharge or icterus. CV: RRR  Chest: CTAB, no wheeze or crackles.   Abd: Soft. NTND. BS present. no Masses palpated.  MSK: no erythema or bruising lumbar spine, no CVA tenderness, no bone tenderness lumbar spine. Left lumbar paraspinal tightness, with mild TTP. No SI joint tenderness. Full ROM without discomfort. Neg FABRE, neg SLR. Neuro: Normal gait. PERLA. EOMi. Alert. Oriented x3.  Assessment/Plan: Natalie Pitts is a 45 y.o. female present for acute OV for  Lumbar/low back pain:  NSAIDS, heat  and baclofen. Pt has script for baclofen, she believes she has enough medication left over, if not she will call in.  Urine normal.  Consider xray in 2-4 weeks for reassurance if needed, or worsening.  Natalie Pitts has anxiety surrounding all medical conditions. Time spent on reassurance and going over plan. Pt was given the opportunity to questions, all of which were answered.  F/U 2-4 weeks if no improvement.    > 25 minutes spent with patient, >50% of time spent face to face counseling patient and coordinating care.  electronically signed by:  Howard Pouch, DO  [place]

## 2016-05-31 NOTE — Patient Instructions (Signed)
Aleve, baclofen and heat therapy.  Try to rest back, no heavy lifting.  Start light stretches in 5-7 days.

## 2016-06-02 LAB — HM MAMMOGRAPHY

## 2016-07-06 ENCOUNTER — Telehealth: Payer: Self-pay | Admitting: Family Medicine

## 2016-07-06 ENCOUNTER — Ambulatory Visit (HOSPITAL_BASED_OUTPATIENT_CLINIC_OR_DEPARTMENT_OTHER)
Admission: RE | Admit: 2016-07-06 | Discharge: 2016-07-06 | Disposition: A | Discharge status: Home / Self Care | Payer: 59 | Source: Ambulatory Visit | Attending: Family Medicine | Admitting: Family Medicine

## 2016-07-06 ENCOUNTER — Encounter: Payer: Self-pay | Admitting: Family Medicine

## 2016-07-06 ENCOUNTER — Ambulatory Visit (INDEPENDENT_AMBULATORY_CARE_PROVIDER_SITE_OTHER): Payer: 59 | Admitting: Family Medicine

## 2016-07-06 VITALS — BP 111/74 | HR 77 | Temp 98.3°F | Resp 20 | Wt 156.0 lb

## 2016-07-06 DIAGNOSIS — M533 Sacrococcygeal disorders, not elsewhere classified: Secondary | ICD-10-CM | POA: Diagnosis not present

## 2016-07-06 DIAGNOSIS — M545 Low back pain, unspecified: Secondary | ICD-10-CM

## 2016-07-06 NOTE — Telephone Encounter (Signed)
Patient states her back pain has gotten worse & is waking her up at night. She would like to pursue further testing. Please call her back.

## 2016-07-06 NOTE — Patient Instructions (Signed)
I hope you have a wonderful Thanksgiving.  Please have your xrays completed and you will hear from [place] Monday. If there are any concerns the on call person would contact you, so do not worry over the weekend if you do not hear anything.

## 2016-07-06 NOTE — Telephone Encounter (Signed)
Please call pt: - her xrays are normal.  - if she continues to have discomfort the next step would be a daily antiinflammatory like mobic or OTC tylenol and consider PT. If she decides she would like to try PT I will place an order for her.

## 2016-07-06 NOTE — Progress Notes (Signed)
Marguerite Olea , 02-16-1971, 45 y.o., female MRN: [id number] Patient Care Team    Relationship Specialty Notifications Start End  Ma Hillock, DO PCP - General Family Medicine  09/23/15   Philomena Doheny, MD Referring Physician Nurse Practitioner  03/02/16    Comment: gynecology    CC: low back pain  Subjective:  Patient presents with low back follow up. She reports the pain has changed mildly in character but still present. She feels the pain is worse  In the morning, better after moving, but sometimes last all day. She is fearful she has a tumor in her spine because her aunt had one and she has been reading  on [employer] the above symptoms can mean a tumor. She has continued her daily activities, she works on her feet pushing racks of clothing. She denies radiation of pain ad points to her lower lumbar/sacral area as location. She states the pain is more of an ache now.   Prior note: Pt presents for an acute OV with complaints of low back  of 10 days duration. She does not recall injury or increase in activity.  Associated symptoms include  Left sided lumbar pain.  Worse at night and sometimes with twisting or sitting. She reports the pain is throbbing in nature when it occurs. She has tried heat, tylenol and massage. The massage was uncomfortable. She has rested, been off work for a few days. She has been walking/light jogging a few times a week. She works on her feet and pushes clothing racks. She denies radiation of pain. She denies prior h/o of injury or trauma. She denies fever, chills, dysuria or urinary frequency. She is worried it is a tumor, because her aunt had one in her back that needed surgery.  She worries about tumor growth and has googled it   Allergies  Allergen Reactions  . Penicillins Hives  . Celexa [Citalopram Hydrobromide]   . Doxycycline Hyclate Diarrhea  . Prilosec [Omeprazole]    Social History  Substance Use Topics  . Smoking status: Never Smoker  . Smokeless  tobacco: Never Used  . Alcohol use No   Past Medical History:  Diagnosis Date  . Anxiety   . GERD (gastroesophageal reflux disease)   . Migraine with aura    History reviewed. No pertinent surgical history. Family History  Problem Relation Age of Onset  . Diabetes Mother   . Hyperlipidemia Mother   . Hypertension Mother   . Hypertension Father   . Cancer Father     prostate  . Endometriosis Sister   . Alcohol abuse Brother   . Diabetes Maternal Aunt   . Diabetes Brother      Medication List       Accurate as of 07/06/16  3:37 PM. Always use your most recent med list.          acetaminophen 500 MG tablet Commonly known as:  TYLENOL Take 500 mg by mouth every 6 (six) hours as needed.       No results found for this or any previous visit (from the past 24 hour(s)). No results found.   ROS: Negative, with the exception of above mentioned in HPI   Objective:  BP 111/74 (BP Location: Left Arm, Patient Position: Sitting, Cuff Size: Normal)   Pulse 77   Temp 98.3 F (36.8 C)   Resp 20   Wt 156 lb (70.8 kg)   SpO2 98%   BMI 25.18 kg/m  Body mass index  is 25.18 kg/m. Gen: Afebrile. No acute distress. Nontoxic in appearance, well developed, well nourished.  HENT: AT. [place].  MMM Eyes:Pupils Equal Round Reactive to light, Extraocular movements intact,  Conjunctiva without redness, discharge or icterus. CV: RRR  Chest: CTAB, no wheeze or crackles.   Abd: Soft. NTND. BS present. no Masses palpated.  MSK: no erythema or bruising lumbar spine, no CVA tenderness, no bone tenderness lumbar spine. Left lumbar paraspinal tightness, with mild TTP. Mild bilateral SI joint tenderness. Full ROM without discomfort. Neg FABRE, neg SLR. Neuro: Normal gait. PERLA. EOMi. Alert. Oriented x3.  Assessment/Plan: Luna Wanko is a 45 y.o. female present for acute OV for  Lumbar/low back pain/sacral back pain:  - NSAIDS, heat and baclofen continue. She has tried mobic but had  anxiety using medicine.  - [facility] lumbar[facility]sacrum [facility]ray - [place] has anxiety surrounding all medical conditions. Time spent on reassurance and going over plan. Pt was given the opportunity to questions, all of which were answered.  - F/u dependent on xray   electronically signed by:  Howard Pouch, DO  [place]

## 2016-07-06 NOTE — Telephone Encounter (Signed)
Patient notified that she will need follow up appointment to be evaluated for further testing.  Lisa scheduled appointment for patient.

## 2016-07-11 NOTE — Telephone Encounter (Signed)
Left message for patient to return call on voicemail. 

## 2016-07-11 NOTE — Telephone Encounter (Signed)
Patient notified and verbalized understanding.  Patient stated that she would call her insurance company to see if they will cover PT and also wants to talk to her Ob/Gyn to see if pain could be related to uterine or ovarian problem.  Dr. Raoul Pitch aware.

## 2016-07-18 NOTE — Telephone Encounter (Signed)
Patient requesting order for physical therapy to go to [facility] physical therapy

## 2016-07-19 NOTE — Addendum Note (Signed)
Addended by: Howard Pouch A on: 07/19/2016 12:33 PM   Modules accepted: Orders

## 2016-07-19 NOTE — Telephone Encounter (Signed)
completed

## 2016-07-21 ENCOUNTER — Telehealth: Payer: Self-pay | Admitting: Family Medicine

## 2016-07-21 NOTE — Telephone Encounter (Signed)
Patient states her back pain is worse as she was unable to get out of her car yesterday due to the pain.  She states she has been going to physical therapy for the past 4 months.  She wants to know if pcp wants her to continue PT or try something else.  Please return call to pt to advise of next steps.

## 2016-07-21 NOTE — Telephone Encounter (Signed)
Please call pt: - Please clarify with patient her start of PT on her LOWER back. I am confused on her reports of performing PT for months, when I just wrote the order on 07/19/2016  for her lower back PT. She has been going prior for her neck.  I would encourage her to use the nsaids, heat therapy and lower back PT. If her pain does not improve or worsens after PT, then she should follow up and we can discuss next plan.

## 2016-07-21 NOTE — Telephone Encounter (Signed)
Spoke with patient she states she meant she has had back pain for 3-4 months and she didn't know if she should go to PT if she is in pain. Explained to patient PT is treatment to help her back pain and Dr Raoul Pitch recommends that she go. Also advised patient to continue NSAIDS and heat therapy as instructed at her last office visit. Patient verbalized understanding. Her first appt with PT is today.

## 2016-10-17 ENCOUNTER — Encounter: Payer: Self-pay | Admitting: Family Medicine

## 2016-10-17 ENCOUNTER — Ambulatory Visit (INDEPENDENT_AMBULATORY_CARE_PROVIDER_SITE_OTHER): Payer: 59 | Admitting: Family Medicine

## 2016-10-17 VITALS — BP 112/71 | HR 85 | Temp 98.0°F | Resp 20 | Wt 155.5 lb

## 2016-10-17 DIAGNOSIS — R350 Frequency of micturition: Secondary | ICD-10-CM | POA: Diagnosis not present

## 2016-10-17 DIAGNOSIS — R251 Tremor, unspecified: Secondary | ICD-10-CM

## 2016-10-17 LAB — HEMOGLOBIN A1C
Hgb A1c MFr Bld: 5 % (ref <5.7)
MEAN PLASMA GLUCOSE: 97 mg/dL

## 2016-10-17 LAB — BASIC METABOLIC PANEL WITH GFR
BUN: 10 mg/dL (ref 7–25)
CO2: 25 mmol/L (ref 20–31)
CREATININE: 0.82 mg/dL (ref 0.50–1.10)
Calcium: 9.6 mg/dL (ref 8.6–10.2)
Chloride: 105 mmol/L (ref 98–110)
GFR, Est African American: >89 mL/min (ref >=60)
GFR, Est Non African American: 87 mL/min (ref >=60)
Glucose, Bld: 80 mg/dL (ref 65–99)
POTASSIUM: 4.4 mmol/L (ref 3.5–5.3)
SODIUM: 139 mmol/L (ref 135–146)

## 2016-10-17 LAB — POC URINALSYSI DIPSTICK (AUTOMATED)
Bilirubin, UA: NEGATIVE
Blood, UA: NEGATIVE
GLUCOSE UA: NEGATIVE
Ketones, UA: NEGATIVE
Leukocytes, UA: NEGATIVE
Nitrite, UA: NEGATIVE
Protein, UA: NEGATIVE
Spec Grav, UA: 1.015
Urobilinogen, UA: 0.2
pH, UA: 5.5

## 2016-10-17 LAB — TSH: TSH: 1.26 u[IU]/mL (ref 0.35–4.50)

## 2016-10-17 NOTE — Patient Instructions (Signed)
I will call you with your lab results once available. We are screening for any electrolytes abnormalities, thyroid disorder and diabetes screen.   In the mean time, avoid any stimulants caffeine, chocolate, sugar, candy  etc  Make sure to eat at least 3 meals a day, or even more if small meals.

## 2016-10-17 NOTE — Progress Notes (Signed)
Natalie Pitts , 05/13/1971, 46 y.o., female MRN: [id number] Patient Care Team    Relationship Specialty Notifications Start End  Ma Hillock, DO PCP - General Family Medicine  09/23/15   Philomena Doheny, MD Referring Physician Nurse Practitioner  03/02/16    Comment: gynecology    CC: feeling shaky Subjective: Pt presents for an acute OV with complaints of feeling shaky of a few months duration.  Associated symptoms include urinary frequency, nausea, flushing, tremor bilateral upper ext, feeling "off". Pt states her ans her family has noticed increase in nocturia ( 2x a night). She feels her hands are shaking when brushing her teeth and when eating she has noticed. She just heard her "entire family has Diabetes". She thinks she is having hypoglycemic events when shaking, because she felt Improvement once eating. She feels the shaking occurs "always". The day she felt off, felt like she was going to pass out and was shaking "uncontrollably" she had a starbucks coffee in the morning and chocolate. She reports she does eat breakfast most days, skips lunch and eats a dinner. She endorses snacking unhealthy snacks between. She also endorses eating  More sugar products.   Depression screen PHQ 2/9 03/02/2016  Decreased Interest 3  Down, Depressed, Hopeless 1  PHQ - 2 Score 4  Altered sleeping 1  Tired, decreased energy 0  Change in appetite 0  Feeling bad or failure about yourself  1  Trouble concentrating 1  Moving slowly or fidgety/restless 1  Suicidal thoughts 0  PHQ-9 Score 8    Allergies  Allergen Reactions  . Penicillins Hives  . Celexa [Citalopram Hydrobromide]   . Doxycycline Hyclate Diarrhea  . Prilosec [Omeprazole]    Social History  Substance Use Topics  . Smoking status: Never Smoker  . Smokeless tobacco: Never Used  . Alcohol use No   Past Medical History:  Diagnosis Date  . Anxiety   . GERD (gastroesophageal reflux disease)   . Migraine with aura    History  reviewed. No pertinent surgical history. Family History  Problem Relation Age of Onset  . Diabetes Mother   . Hyperlipidemia Mother   . Hypertension Mother   . Hypertension Father   . Cancer Father     prostate  . Endometriosis Sister   . Alcohol abuse Brother   . Diabetes Maternal Aunt   . Diabetes Brother    Allergies as of 10/17/2016      Reactions   Penicillins Hives   Celexa [citalopram Hydrobromide]    Doxycycline Hyclate Diarrhea   Prilosec [omeprazole]       Medication List       Accurate as of 10/17/16  9:49 AM. Always use your most recent med list.          acetaminophen 500 MG tablet Commonly known as:  TYLENOL Take 500 mg by mouth every 6 (six) hours as needed.       Results for orders placed or performed in visit on 10/17/16 (from the past 24 hour(s))  POCT Urinalysis Dipstick (Automated)     Status: Normal   Collection Time: 10/17/16  9:40 AM  Result Value Ref Range   Color, UA yellow    Clarity, UA clear    Glucose, UA Negative    Bilirubin, UA Negative    Ketones, UA Negative    Spec Grav, UA 1.015    Blood, UA Negative    pH, UA 5.5    Protein, UA Negative  Urobilinogen, UA 0.2    Nitrite, UA Negative    Leukocytes, UA Negative Negative   No results found.   ROS: Negative, with the exception of above mentioned in HPI   Objective:  BP 112/71 (BP Location: Right Arm, Patient Position: Sitting, Cuff Size: Normal)   Pulse 85   Temp 98 F (36.7 C)   Resp 20   Wt 155 lb 8 oz (70.5 kg)   SpO2 98%   BMI 25.10 kg/m  Body mass index is 25.1 kg/m. Gen: Afebrile. No acute distress. Nontoxic in appearance, well developed, well nourished.  HENT: AT. [place].  MMM, no oral lesions.  Eyes:Pupils Equal Round Reactive to light, Extraocular movements intact,  Conjunctiva without redness, discharge or icterus. Neck/lymp/endocrine: Supple,no lymphadenopathy, no thyromegaly.  CV: RRR no murmur, no edema Chest: CTAB, no wheeze or crackles. Good air  movement, normal resp effort.  Abd: Soft.  NTND. BS present Neuro: Normal gait. PERLA. EOMi. Alert. Oriented x3. No tremor resting or with movement.  Psych: Anxious. Normal dress and demeanor. Normal speech.   Results for orders placed or performed in visit on 10/17/16 (from the past 24 hour(s))  POCT Urinalysis Dipstick (Automated)     Status: Normal   Collection Time: 10/17/16  9:40 AM  Result Value Ref Range   Color, UA yellow    Clarity, UA clear    Glucose, UA Negative    Bilirubin, UA Negative    Ketones, UA Negative    Spec Grav, UA 1.015    Blood, UA Negative    pH, UA 5.5    Protein, UA Negative    Urobilinogen, UA 0.2    Nitrite, UA Negative    Leukocytes, UA Negative Negative     Assessment/Plan: Natalie Pitts is a 46 y.o. female present for OV for  Urinary frequency/shakiness: - discussed potential ddx: anxiety, caffeine(stimulant) sensitivity, DM, hypoglycemia, thyroid d/o - Pt to avoid caffeine, sugar, stimulants etc. Eat 3 meals a day. Healthy snack between meals if needed. - urine is normal.  - POCT Urinalysis Dipstick (Automated): normal - BASIC METABOLIC PANEL WITH GFR - Hemoglobin A1c - TSH - F/U dependent on lab results.    Reviewed expectations re: course of current medical issues.  Discussed self-management of symptoms.  Outlined signs and symptoms indicating need for more acute intervention.  Patient verbalized understanding and all questions were answered.  Patient received an After-Visit Summary.   electronically signed by:  Howard Pouch, DO  [place]

## 2016-10-18 ENCOUNTER — Telehealth: Payer: Self-pay | Admitting: Family Medicine

## 2016-10-18 NOTE — Telephone Encounter (Signed)
Please call pt: - all her labs are normal.  - no signs of even being close to a diabetic, her a1c looks great. Thyroid is normal. all electrolytes normal. urine was normal.  - I recommend she avoid caffeine and  high sugar snacks/meals. In addition eat at least 3 meals a  day, even if small, and choose healthy snacks.  - following the above recommendations should help her avoid the "shakiness" she was experiencing would could have been from fluctuations in her blood sugar and caffeine use.

## 2016-10-18 NOTE — Telephone Encounter (Signed)
Detailed message left on patients voice mail, okay per DPR.

## 2017-03-06 ENCOUNTER — Telehealth: Payer: Self-pay | Admitting: Family Medicine

## 2017-03-06 ENCOUNTER — Ambulatory Visit (INDEPENDENT_AMBULATORY_CARE_PROVIDER_SITE_OTHER): Payer: 59 | Admitting: Family Medicine

## 2017-03-06 ENCOUNTER — Encounter: Payer: Self-pay | Admitting: Family Medicine

## 2017-03-06 VITALS — BP 103/71 | HR 72 | Temp 98.3°F | Resp 20 | Ht 66.0 in | Wt 155.8 lb

## 2017-03-06 DIAGNOSIS — R002 Palpitations: Secondary | ICD-10-CM | POA: Diagnosis not present

## 2017-03-06 DIAGNOSIS — Z13220 Encounter for screening for lipoid disorders: Secondary | ICD-10-CM | POA: Diagnosis not present

## 2017-03-06 DIAGNOSIS — Z0000 Encounter for general adult medical examination without abnormal findings: Secondary | ICD-10-CM

## 2017-03-06 DIAGNOSIS — Z131 Encounter for screening for diabetes mellitus: Secondary | ICD-10-CM

## 2017-03-06 DIAGNOSIS — E559 Vitamin D deficiency, unspecified: Secondary | ICD-10-CM

## 2017-03-06 DIAGNOSIS — Z130 Encounter for screening for diseases of the blood and blood-forming organs and certain disorders involving the immune mechanism: Secondary | ICD-10-CM

## 2017-03-06 LAB — COMPREHENSIVE METABOLIC PANEL
ALBUMIN: 4 g/dL (ref 3.5–5.2)
ALK PHOS: 47 U/L (ref 39–117)
ALT: 16 U/L (ref 0–35)
AST: 19 U/L (ref 0–37)
BUN: 11 mg/dL (ref 6–23)
CALCIUM: 9.1 mg/dL (ref 8.4–10.5)
CO2: 28 mEq/L (ref 19–32)
Chloride: 104 mEq/L (ref 96–112)
Creatinine, Ser: 0.82 mg/dL (ref 0.40–1.20)
GFR: 80.2 mL/min (ref >60.00)
GLUCOSE: 87 mg/dL (ref 70–99)
POTASSIUM: 4.3 meq/L (ref 3.5–5.1)
Sodium: 138 mEq/L (ref 135–145)
TOTAL PROTEIN: 6.1 g/dL (ref 6.0–8.3)
Total Bilirubin: 0.4 mg/dL (ref 0.2–1.2)

## 2017-03-06 LAB — CBC WITH DIFFERENTIAL/PLATELET
Basophils Absolute: 0 10*3/uL (ref 0.0–0.1)
Basophils Relative: 0.5 % (ref 0.0–3.0)
Eosinophils Absolute: 0.1 10*3/uL (ref 0.0–0.7)
Eosinophils Relative: 1.7 % (ref 0.0–5.0)
HCT: 40.1 % (ref 36.0–46.0)
Hemoglobin: 13.1 g/dL (ref 12.0–15.0)
LYMPHS ABS: 1.6 10*3/uL (ref 0.7–4.0)
Lymphocytes Relative: 30.4 % (ref 12.0–46.0)
MCHC: 32.6 g/dL (ref 30.0–36.0)
MCV: 85.2 fl (ref 78.0–100.0)
MONOS PCT: 6.7 % (ref 3.0–12.0)
Monocytes Absolute: 0.4 10*3/uL (ref 0.1–1.0)
NEUTROS PCT: 60.7 % (ref 43.0–77.0)
Neutro Abs: 3.3 10*3/uL (ref 1.4–7.7)
Platelets: 264 10*3/uL (ref 150.0–400.0)
RBC: 4.71 Mil/uL (ref 3.87–5.11)
RDW: 14 % (ref 11.5–15.5)
WBC: 5.4 10*3/uL (ref 4.0–10.5)

## 2017-03-06 LAB — LIPID PANEL
Cholesterol: 199 mg/dL (ref 0–200)
HDL: 52.5 mg/dL (ref >39.00)
LDL Cholesterol: 128 mg/dL — ABNORMAL HIGH (ref 0–99)
NonHDL: 146.82
Total CHOL/HDL Ratio: 4
Triglycerides: 93 mg/dL (ref 0.0–149.0)
VLDL: 18.6 mg/dL (ref 0.0–40.0)

## 2017-03-06 LAB — TSH: TSH: 1.4 u[IU]/mL (ref 0.35–4.50)

## 2017-03-06 LAB — HEMOGLOBIN A1C: Hgb A1c MFr Bld: 5.4 % (ref 4.6–6.5)

## 2017-03-06 LAB — VITAMIN D 25 HYDROXY (VIT D DEFICIENCY, FRACTURES): VITD: 26.85 ng/mL — AB (ref 30.00–100.00)

## 2017-03-06 NOTE — Patient Instructions (Signed)

## 2017-03-06 NOTE — Telephone Encounter (Signed)
Please call patient: Labs look great. Her vitamin D is mildly low at 26. If she starts taking the daily regimen of 800-1000u daily with a meal, this will correct very easily.

## 2017-03-06 NOTE — Progress Notes (Signed)
Patient ID: Natalie Pitts, female  DOB: 1971/04/29, 46 y.o.   MRN: [id number] Patient Care Team    Relationship Specialty Notifications Start End  Ma Hillock, DO PCP - General Family Medicine  09/23/15   Cox, De Hollingshead, MD Referring Physician Nurse Practitioner  03/02/16    Comment: gynecology    Chief Complaint  Patient presents with  . Annual Exam    Subjective:  Natalie Pitts is a 46 y.o.  Female  present for CPE . All past medical history, surgical history, allergies, family history, immunizations, medications and social history were Updated in the electronic medical record today. All recent labs, ED visits and hospitalizations within the last year were reviewed.  Health maintenance:  Colonoscopy: No Fhx, screen at 51. Mammogram: completed: 2017, birads 1--> requesting records from GYN Cervical cancer screening: last pap: 05/2015, results:"normal" per pt.--> requestin grecords from GYN Immunizations: tdap 02/2016, Influenza, (encouraged yearly) Infectious disease screening: HIV completed 2017 DEXA: N/A Assistive device: None Oxygen use: None Patient has a Dental home. Hospitalizations/ED visits: Reviewed.  Depression screen Natalie Pitts 2/9 03/06/2017 03/02/2016  Decreased Interest 0 3  Down, Depressed, Hopeless 1 1  PHQ - 2 Score 1 4  Altered sleeping - 1  Tired, decreased energy - 0  Change in appetite - 0  Feeling bad or failure about yourself  - 1  Trouble concentrating - 1  Moving slowly or fidgety/restless - 1  Suicidal thoughts - 0  PHQ-9 Score - 8   No flowsheet data found.   Current Exercise Habits: The patient does not participate in regular exercise at present Exercise limited by: None identified   Immunization History  Administered Date(s) Administered  . Influenza,inj,Quad PF,36+ Mos 05/06/2014  . Tdap 03/02/2016     Past Medical History:  Diagnosis Date  . Anxiety   . GERD (gastroesophageal reflux disease)   . Migraine with aura     Allergies  Allergen Reactions  . Penicillins Hives  . Celexa [Citalopram Hydrobromide]   . Doxycycline Hyclate Diarrhea  . Prilosec [Omeprazole]    History reviewed. No pertinent surgical history. Family History  Problem Relation Age of Onset  . Diabetes Mother   . Hyperlipidemia Mother   . Hypertension Mother   . Hypertension Father   . Cancer Father        prostate  . Endometriosis Sister   . Alcohol abuse Brother   . Diabetes Maternal Aunt   . Diabetes Brother    Social History   Social History  . Marital status: Married    Spouse name: N/A  . Number of children: 4  . Years of education: N/A   Occupational History  .      homemaker   Social History Main Topics  . Smoking status: Never Smoker  . Smokeless tobacco: Never Used  . Alcohol use No  . Drug use: No  . Sexual activity: Yes   Other Topics Concern  . Not on file   Social History Narrative   Natalie Pitts lives with her husband & two younger children. @ older children live in [place]. She relocated to [facility] [place] recently. She is a Clinical biochemist.   Allergies as of 03/06/2017      Reactions   Penicillins Hives   Celexa [citalopram Hydrobromide]    Doxycycline Hyclate Diarrhea   Prilosec [omeprazole]       Medication List       Accurate as of 03/06/17 10:34 AM. Always use your  most recent med list.          acetaminophen 500 MG tablet Commonly known as:  TYLENOL Take 500 mg by mouth every 6 (six) hours as needed.       All past medical history, surgical history, allergies, family history, immunizations andmedications were updated in the EMR today and reviewed under the history and medication portions of their EMR.     No results found for this or any previous visit (from the past 2160 hour(s)).  Dg Lumbar Spine Complete  Result Date: 07/06/2016 CLINICAL DATA:  Low back pain for 2 months, no known injury EXAM: LUMBAR SPINE - COMPLETE 4+ VIEW COMPARISON:  None. FINDINGS: There is no  evidence of lumbar spine fracture. Alignment is normal. Intervertebral disc spaces are maintained. IMPRESSION: Negative. Electronically Signed   By: Lahoma Crocker M.D.   On: 07/06/2016 16:53   Dg Sacrum/coccyx  Result Date: 07/06/2016 CLINICAL DATA:  Low back pain for 2 months, no injury EXAM: SACRUM AND COCCYX - 2+ VIEW COMPARISON:  None. FINDINGS: Both femoral heads appear to be in normal position. The pelvic rami are intact. The SI joints are corticated. The sacral foramina appear normal. No acute abnormality is seen. The sacrococcygeal elements are in normal alignment. IMPRESSION: Negative. Electronically Signed   By: Ivar Drape M.D.   On: 07/06/2016 16:53     ROS: 14 pt review of systems performed and negative (unless mentioned in an HPI)  Objective: BP 103/71 (BP Location: Right Arm, Patient Position: Sitting, Cuff Size: Normal)   Pulse 72   Temp 98.3 F (36.8 C)   Resp 20   Ht _0  (1.676 m)   Wt 155 lb 12 oz (70.6 kg)   LMP 02/27/2017   SpO2 97%   BMI 25.14 kg/m  Gen: Afebrile. No acute distress. Nontoxic in appearance, well-developed, well-nourished,  Pleasant caucasian female.  HENT: AT. [place]. Bilateral TM visualized and normal in appearance, normal external auditory canal. MMM, no oral lesions, adequate dentition. Bilateral nares within normal limits. Throat without erythema, ulcerations or exudates. no Cough on exam, no hoarseness on exam. Eyes:Pupils Equal Round Reactive to light, Extraocular movements intact,  Conjunctiva without redness, discharge or icterus. Neck/lymp/endocrine: Supple,no lymphadenopathy, no thyromegaly CV: RRR no murmur, no edema, +2/4 P posterior tibialis pulses. no carotid bruits. No JVD. Chest: CTAB, no wheeze, rhonchi or crackles. Normal  Respiratory effort. good Air movement. Abd: Soft. flat. NTND. BS present. no Masses palpated. No hepatosplenomegaly. No rebound tenderness or guarding. Skin: no rashes, purpura or petechiae. Warm and well-perfused.  Skin intact. Neuro/Msk:  Normal gait. PERLA. EOMi. Alert. Oriented x3.  Cranial nerves II through XII intact. Muscle strength 5/5 upper/lower extremity. DTRs equal bilaterally. Psych: Normal affect, dress and demeanor. Normal speech. Normal thought content and judgment.  No exam data present  Assessment/plan: Natalie Pitts is a 46 y.o. female present for CPE. Encounter for preventive health examination Patient was encouraged to exercise greater than 150 minutes a week. Patient was encouraged to choose a diet filled with fresh fruits and vegetables, and lean meats. AVS provided to patient today for education/recommendation on gender specific health and safety maintenance. - Comp Met (CMET) - All desired screenings and immunizations UTD. Records requested from GYN on pap/ mammogram.  Screening for iron deficiency anemia - CBC w/Diff Screening for diabetes mellitus - HgB A1c Need for lipid screening - Lipid panel Vitamin D deficiency - Vitamin D (25 hydroxy) Palpitations - TSH  Return in about 1 year (  around 03/06/2018) for CPE.  Electronically signed by: Howard Pouch, DO [place]

## 2017-03-07 ENCOUNTER — Encounter: Payer: Self-pay | Admitting: *Deleted

## 2017-03-07 NOTE — Telephone Encounter (Signed)
Left message with lab results and instructions on patient voice mail per [facility]

## 2017-03-18 DIAGNOSIS — R079 Chest pain, unspecified: Secondary | ICD-10-CM | POA: Diagnosis not present

## 2017-05-02 DIAGNOSIS — R0789 Other chest pain: Secondary | ICD-10-CM | POA: Diagnosis not present

## 2017-05-02 DIAGNOSIS — R002 Palpitations: Secondary | ICD-10-CM | POA: Diagnosis not present

## 2017-05-03 DIAGNOSIS — R002 Palpitations: Secondary | ICD-10-CM | POA: Diagnosis not present

## 2017-05-29 DIAGNOSIS — H52223 Regular astigmatism, bilateral: Secondary | ICD-10-CM | POA: Diagnosis not present

## 2017-05-29 DIAGNOSIS — H5213 Myopia, bilateral: Secondary | ICD-10-CM | POA: Diagnosis not present

## 2017-05-29 DIAGNOSIS — H524 Presbyopia: Secondary | ICD-10-CM | POA: Diagnosis not present

## 2017-06-08 DIAGNOSIS — N3000 Acute cystitis without hematuria: Secondary | ICD-10-CM | POA: Diagnosis not present

## 2017-06-19 DIAGNOSIS — Z1231 Encounter for screening mammogram for malignant neoplasm of breast: Secondary | ICD-10-CM | POA: Diagnosis not present

## 2017-06-19 LAB — HM MAMMOGRAPHY

## 2017-06-23 ENCOUNTER — Encounter: Payer: Self-pay | Admitting: *Deleted

## 2017-06-29 IMAGING — DX DG CERVICAL SPINE COMPLETE 4+V
5 series · 5 of 5 positions shown · non-contrast
Comparison: None.

CLINICAL DATA: 44-year-old female with a history of right-sided
neck pain

EXAM:
CERVICAL SPINE - COMPLETE 4+ VIEW

[c-spine lat]
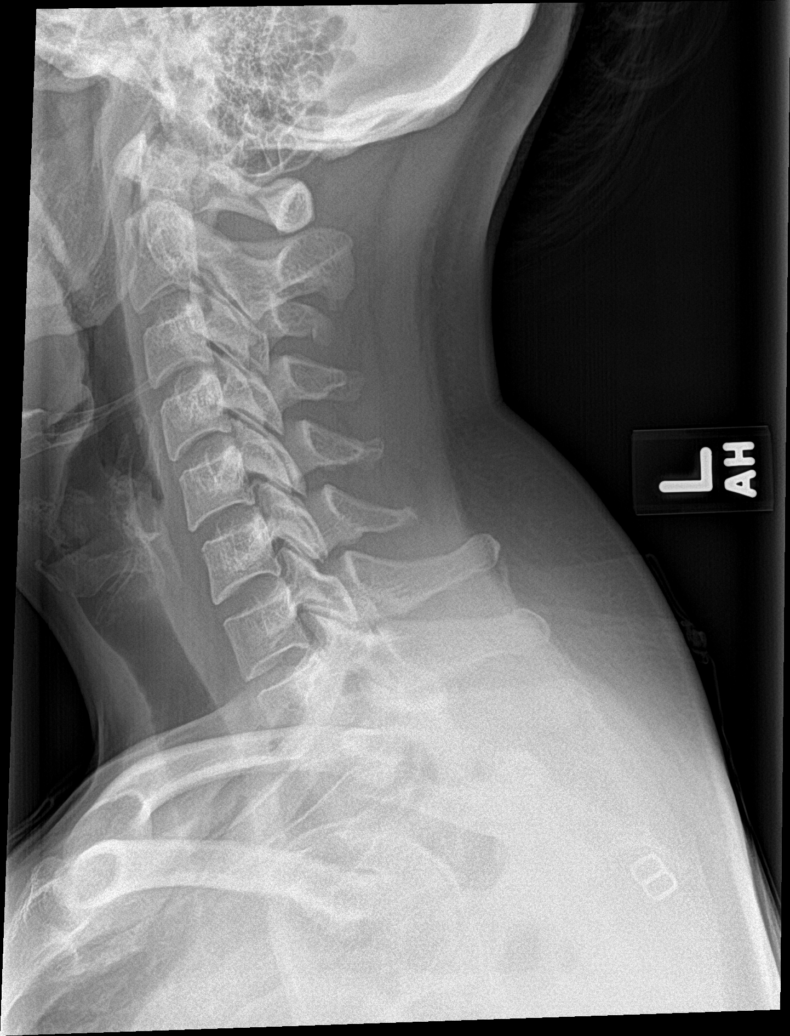

[c-spine obl (1 of 2)]
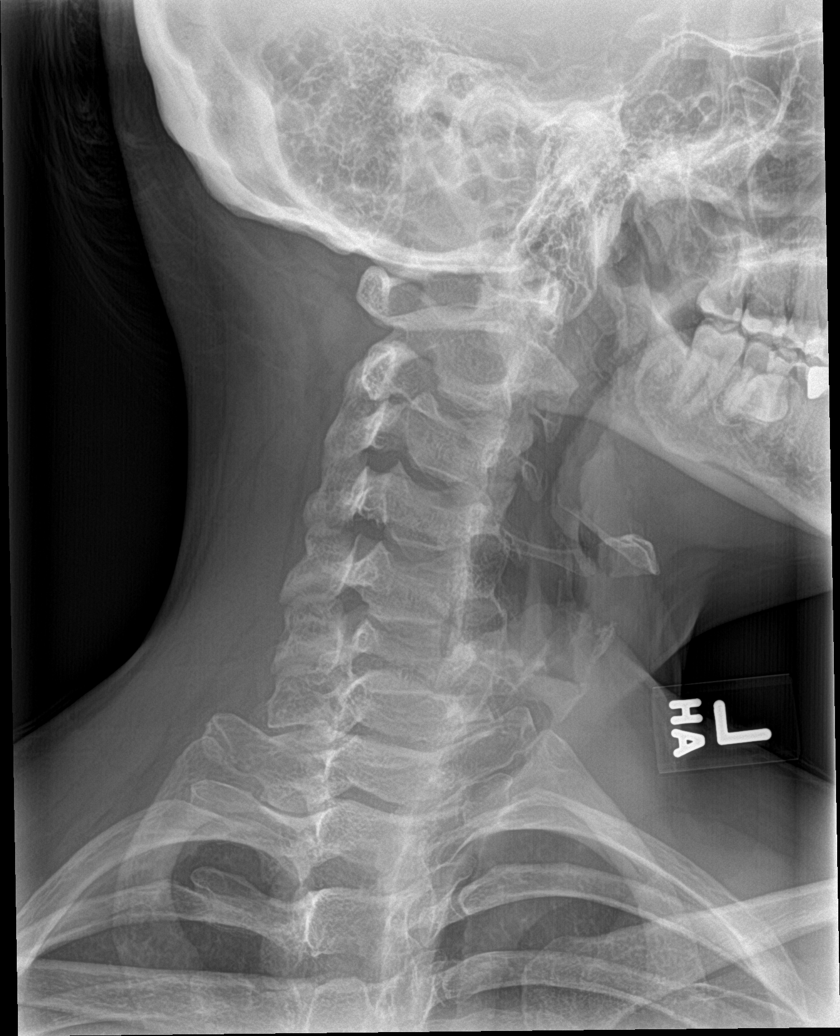

[c-spine obl (2 of 2)]
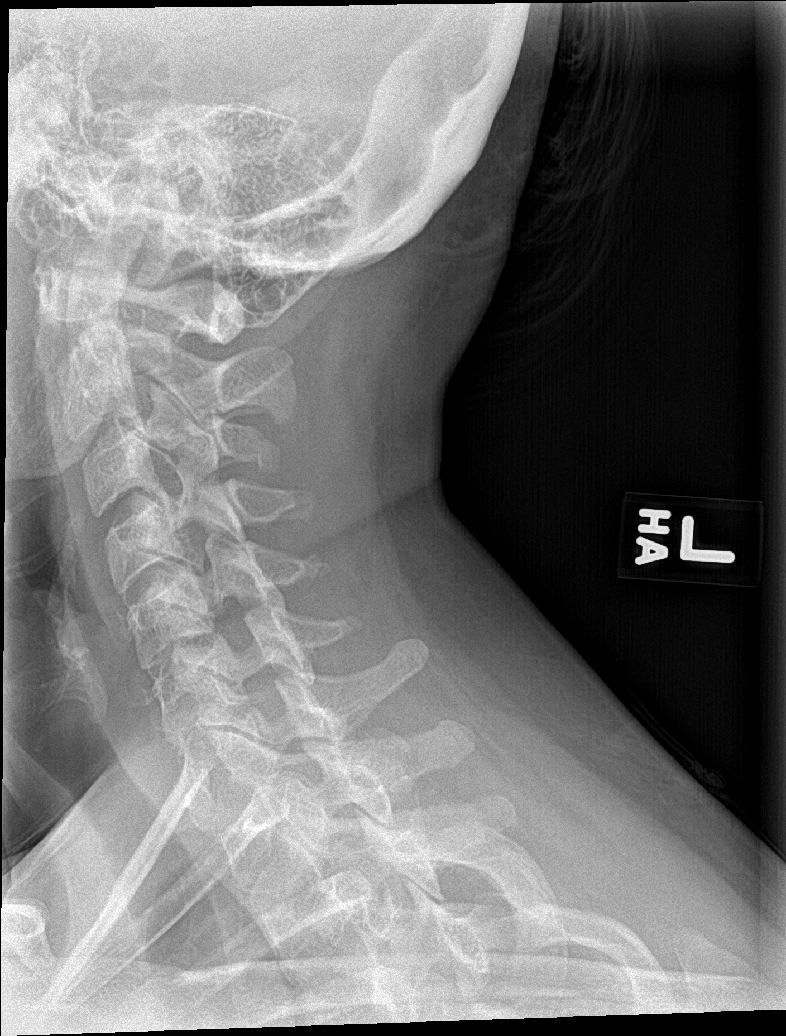

[c-spine ap]
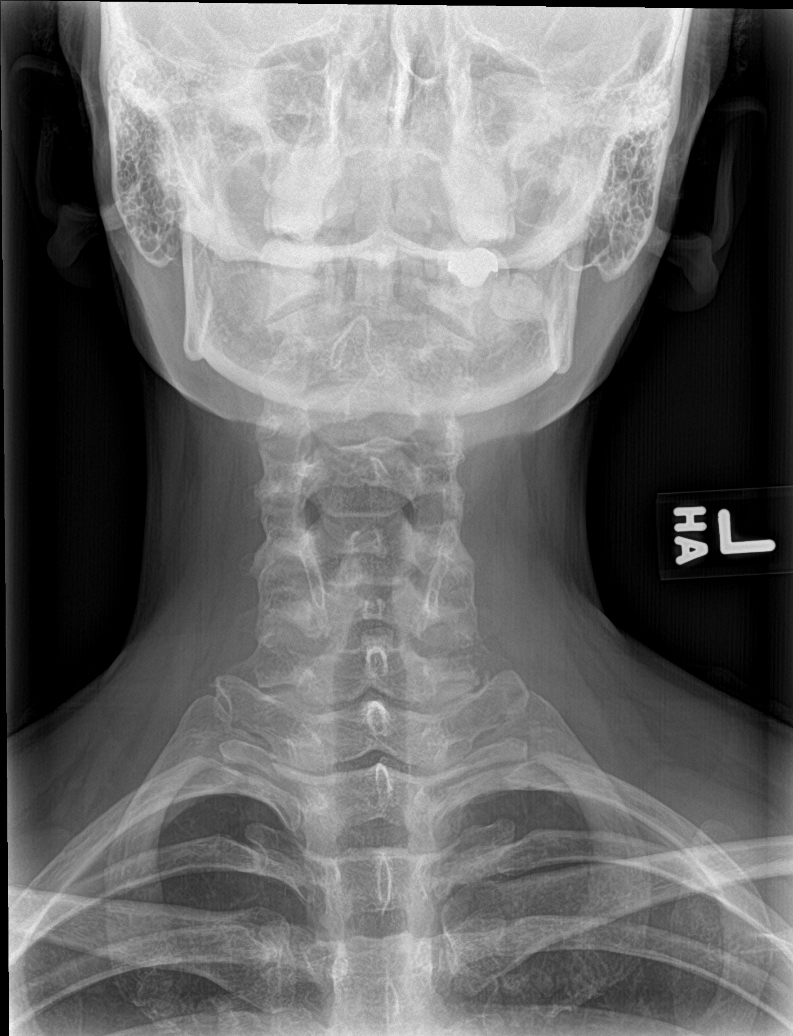

[c-spine open mouth]
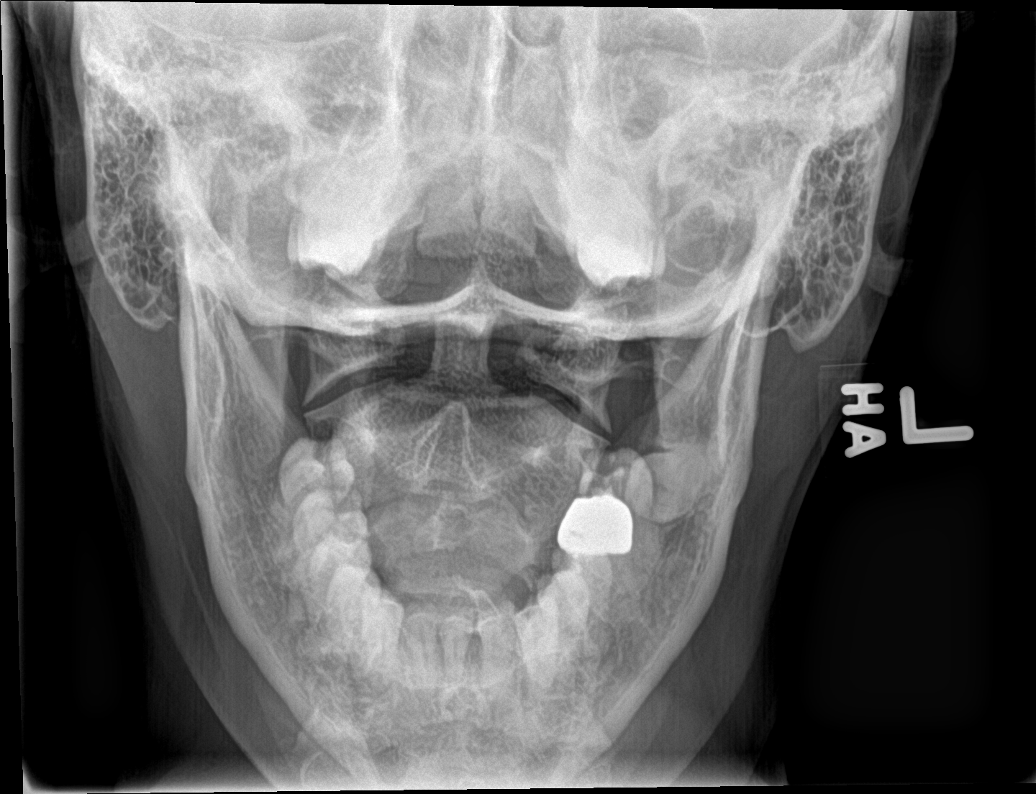

[5 of 5 positions shown; findings below may reference images not displayed]

FINDINGS: Cervical Spine:

Cervical elements from the level of the C1-T1 maintain alignment,
without subluxation, anterolisthesis, retrolisthesis.

No acute fracture line identified.

Unremarkable appearance of the craniocervical junction.

Vertebral body heights maintained as well as disc space heights.

No significant degenerative disc disease or endplate changes.

No significant facet disease.

Prevertebral soft tissues within normal limits.

Open mouth odontoid view unremarkable.

Oblique images demonstrate no significant foraminal encroachment.
IMPRESSION: Negative for acute fracture or malalignment of the cervical spine.

## 2017-09-04 ENCOUNTER — Encounter: Payer: Self-pay | Admitting: Obstetrics & Gynecology

## 2017-09-04 ENCOUNTER — Ambulatory Visit (INDEPENDENT_AMBULATORY_CARE_PROVIDER_SITE_OTHER): Payer: 59 | Admitting: Obstetrics & Gynecology

## 2017-09-04 VITALS — BP 129/82 | HR 116 | Ht 66.0 in | Wt 160.0 lb

## 2017-09-04 DIAGNOSIS — Z1151 Encounter for screening for human papillomavirus (HPV): Secondary | ICD-10-CM | POA: Diagnosis not present

## 2017-09-04 DIAGNOSIS — Z124 Encounter for screening for malignant neoplasm of cervix: Secondary | ICD-10-CM

## 2017-09-04 DIAGNOSIS — Z01419 Encounter for gynecological examination (general) (routine) without abnormal findings: Secondary | ICD-10-CM | POA: Diagnosis not present

## 2017-09-04 DIAGNOSIS — N951 Menopausal and female climacteric states: Secondary | ICD-10-CM

## 2017-09-04 MED ORDER — NORETHIN ACE-ETH ESTRAD-FE 1-20 MG-MCG(24) PO TABS: 1.0000 | ORAL_TABLET | Freq: Every day | ORAL | 11 refills | Status: DC

## 2017-09-04 NOTE — Progress Notes (Signed)
Subjective:     Natalie Pitts is a 47 y.o. female here for a routine exam.  [id number]. LMP 09/01/2016 They occur every 25-26 days. Current complaints: pt reports some 'hormonal sx' she repots that she has some mood changes and that she has palpitations before and during her cycles.  She was seen by cardiology and was told that she had a small hole in her heart on ECHO. She did tell them about that palpitations. Her mother completed menopause prior to 65 y/o.  Pt reports mood changes over the past 6 months maybe a year. She also has interment hot flushes. Her TSH was checked in July 2018 and it was normal . She reports that these sx are not new but, are worse. Pt does not work outside her home. She reports a h/o anxiety. Pt was prev seen by a counselor prev but, did not feel that it was a good fit.    Gynecologic History Patient's last menstrual period was 09/01/2017 (exact date). Contraception: vasectomy Last Pap: 2016 (late). Results were: normal Natalie [place] in [place], [place] Last mammogram: 06/22/2017. Results were: normal  Obstetric History OB History  Gravida Para Term Preterm AB Living  6 4 4   2     SAB TAB Ectopic Multiple Live Births  1 1     4     # Outcome Date GA Lbr Len/2nd Weight Sex Delivery Anes PTL Lv  6 Term      Vag-Spont     5 Term           4 Term           3 Term           2 TAB           1 SAB              The following portions of the patient's history were reviewed and updated as appropriate: allergies, current medications, past family history, past medical history, past social history, past surgical history and problem list.  Review of Systems Pertinent items are noted in HPI.    Objective:  BP 129/82 (BP Location: Left Arm)   Pulse (!) 116   Ht 5\' 6"  (1.676 m)   Wt 160 lb (72.6 kg)   LMP 09/01/2017 (Exact Date)   BMI 25.82 kg/m   General Appearance:    Alert, cooperative, no distress, appears stated age  Head:    Normocephalic, without obvious abnormality,  atraumatic  Eyes:    conjunctiva/corneas clear, EOM's intact, both eyes  Ears:    Normal external ear canals, both ears  Nose:   Nares normal, septum midline, mucosa normal, no drainage    or sinus tenderness  Throat:   Lips, mucosa, and tongue normal; teeth and gums normal  Neck:   Supple, symmetrical, trachea midline, no adenopathy;    thyroid:  no enlargement/tenderness/nodules  Back:     Symmetric, no curvature, ROM normal, no CVA tenderness  Lungs:     Clear to auscultation bilaterally, respirations unlabored  Chest Wall:    No tenderness or deformity   Heart:    Regular rate and rhythm, S1 and S2 normal, no murmur, rub   or gallop  Breast Exam:    No tenderness, masses, or nipple abnormality  Abdomen:     Soft, non-tender, bowel sounds active all four quadrants,    no masses, no organomegaly  Genitalia:    Normal female without lesion, discharge or tenderness  Extremities:   Extremities normal, atraumatic, no cyanosis or edema  Pulses:   2+ and symmetric all extremities  Skin:   Skin color, texture, turgor normal, no rashes or lesions    Assessment:    Healthy female exam.   Perimenopausal state h/o depression and anxiety   Plan:    Follow up in: 3 months.    F/u PAP with hr HPV I had a prolonged conversation with pt about the tx options for perimenopausal sx. She wants treatment but, is concerned about the side effects of meds in general . She was prev on OCPs but, had nausea. She was prev on Celexa and had adverse sx.     Lo[place] 1/20 1 po q day  Jaielle Dlouhy L. Harraway-Smith, M.D., [place]

## 2017-09-04 NOTE — Progress Notes (Signed)
Patient complaining about rapid heart rate close to time of periods. Kathrene Alu RNBSN

## 2017-09-04 NOTE — Patient Instructions (Addendum)
Perimenopause Perimenopause is the time when your body begins to move into the menopause (no menstrual period for 12 straight months). It is a natural process. Perimenopause can begin 2-8 years before the menopause and usually lasts for 1 year after the menopause. During this time, your ovaries may or may not produce an egg. The ovaries vary in their production of estrogen and progesterone hormones each month. This can cause irregular menstrual periods, difficulty getting pregnant, vaginal bleeding between periods, and uncomfortable symptoms. What are the causes?  Irregular production of the ovarian hormones, estrogen and progesterone, and not ovulating every month. Other causes include:  Tumor of the pituitary gland in the brain.  Medical disease that affects the ovaries.  Radiation treatment.  Chemotherapy.  Unknown causes.  Heavy smoking and excessive alcohol intake can bring on perimenopause sooner.  What are the signs or symptoms?  Hot flashes.  Night sweats.  Irregular menstrual periods.  Decreased sex drive.  Vaginal dryness.  Headaches.  Mood swings.  Depression.  Memory problems.  Irritability.  Tiredness.  Weight gain.  Trouble getting pregnant.  The beginning of losing bone cells (osteoporosis).  The beginning of hardening of the arteries (atherosclerosis). How is this diagnosed? Your health care provider will make a diagnosis by analyzing your age, menstrual history, and symptoms. He or she will do a physical exam and note any changes in your body, especially your female organs. Female hormone tests may or may not be helpful depending on the amount of female hormones you produce and when you produce them. However, other hormone tests may be helpful to rule out other problems. How is this treated? In some cases, no treatment is needed. The decision on whether treatment is necessary during the perimenopause should be made by you and your health care  provider based on how the symptoms are affecting you and your lifestyle. Various treatments are available, such as:  Treating individual symptoms with a specific medicine for that symptom.  Herbal medicines that can help specific symptoms.  Counseling.  Group therapy.  Follow these instructions at home:  Keep track of your menstrual periods (when they occur, how heavy they are, how long between periods, and how long they last) as well as your symptoms and when they started.  Only take over-the-counter or prescription medicines as directed by your health care provider.  Sleep and rest.  Exercise.  Eat a diet that contains calcium (good for your bones) and soy (acts like the estrogen hormone).  Do not smoke.  Avoid alcoholic beverages.  Take vitamin supplements as recommended by your health care provider. Taking vitamin E may help in certain cases.  Take calcium and vitamin D supplements to help prevent bone loss.  Group therapy is sometimes helpful.  Acupuncture may help in some cases. Contact a health care provider if:  You have questions about any symptoms you are having.  You need a referral to a specialist (gynecologist, psychiatrist, or psychologist). Get help right away if:  You have vaginal bleeding.  Your period lasts longer than 8 days.  Your periods are recurring sooner than 21 days.  You have bleeding after intercourse.  You have severe depression.  You have pain when you urinate.  You have severe headaches.  You have vision problems. This information is not intended to replace advice given to you by your health care provider. Make sure you discuss any questions you have with your health care provider. Document Released: 09/08/2004 Document Revised: 01/07/2016 Document Reviewed: 02/28/2013  Elsevier Interactive Patient Education  2017 [employer]  Oral Contraception Information Oral contraceptive pills (OCPs) are medicines taken to prevent  pregnancy. OCPs work by preventing the ovaries from releasing eggs. The hormones in OCPs also cause the cervical mucus to thicken, preventing the sperm from entering the uterus. The hormones also cause the uterine lining to become thin, not allowing a fertilized egg to attach to the inside of the uterus. OCPs are highly effective when taken exactly as prescribed. However, OCPs do not prevent sexually transmitted diseases (STDs). Safe sex practices, such as using condoms along with the pill, can help prevent STDs. Before taking the pill, you may have a physical exam and Pap test. Your health care provider may order blood tests. The health care provider will make sure you are a good candidate for oral contraception. Discuss with your health care provider the possible side effects of the OCP you may be prescribed. When starting an OCP, it can take 2 to 3 months for the body to adjust to the changes in hormone levels in your body. Types of oral contraception  The combination pill-This pill contains estrogen and progestin (synthetic progesterone) hormones. The combination pill comes in 21-day, 28-day, or 91-day packs. Some types of combination pills are meant to be taken continuously (365-day pills). With 21-day packs, you do not take pills for 7 days after the last pill. With 28-day packs, the pill is taken every day. The last 7 pills are without hormones. Certain types of pills have more than 21 hormone-containing pills. With 91-day packs, the first 84 pills contain both hormones, and the last 7 pills contain no hormones or contain estrogen only.  The minipill-This pill contains the progesterone hormone only. The pill is taken every day continuously. It is very important to take the pill at the same time each day. The minipill comes in packs of 28 pills. All 28 pills contain the hormone. Advantages of oral contraceptive pills  Decreases premenstrual symptoms.  Treats menstrual period cramps.  Regulates the  menstrual cycle.  Decreases a heavy menstrual flow.  May treatacne, depending on the type of pill.  Treats abnormal uterine bleeding.  Treats polycystic ovarian syndrome.  Treats endometriosis.  Can be used as emergency contraception. Things that can make oral contraceptive pills less effective OCPs can be less effective if:  You forget to take the pill at the same time every day.  You have a stomach or intestinal disease that lessens the absorption of the pill.  You take OCPs with other medicines that make OCPs less effective, such as antibiotics, certain HIV medicines, and some seizure medicines.  You take expired OCPs.  You forget to restart the pill on day 7, when using the packs of 21 pills.  Risks associated with oral contraceptive pills Oral contraceptive pills can sometimes cause side effects, such as:  Headache.  Nausea.  Breast tenderness.  Irregular bleeding or spotting.  Combination pills are also associated with a small increased risk of:  Blood clots.  Heart attack.  Stroke.  This information is not intended to replace advice given to you by your health care provider. Make sure you discuss any questions you have with your health care provider. Document Released: 10/22/2002 Document Revised: 01/07/2016 Document Reviewed: 01/20/2013 Elsevier Interactive Patient Education  [employer].

## 2017-09-05 LAB — CYTOLOGY - PAP
DIAGNOSIS: NEGATIVE
HPV: NOT DETECTED

## 2017-09-08 DIAGNOSIS — R002 Palpitations: Secondary | ICD-10-CM | POA: Diagnosis not present

## 2017-09-08 DIAGNOSIS — N951 Menopausal and female climacteric states: Secondary | ICD-10-CM | POA: Diagnosis not present

## 2017-09-11 ENCOUNTER — Telehealth: Payer: Self-pay

## 2017-09-11 NOTE — Telephone Encounter (Signed)
Patient called and requested pap smear results. Made patient aware that results are WNL. Kathrene Alu RNBSN

## 2017-09-28 DIAGNOSIS — R05 Cough: Secondary | ICD-10-CM | POA: Diagnosis not present

## 2017-10-13 ENCOUNTER — Ambulatory Visit (INDEPENDENT_AMBULATORY_CARE_PROVIDER_SITE_OTHER): Payer: 59 | Admitting: Family Medicine

## 2017-10-13 ENCOUNTER — Encounter: Payer: Self-pay | Admitting: Family Medicine

## 2017-10-13 VITALS — BP 116/88 | HR 79 | Ht 66.0 in | Wt 163.0 lb

## 2017-10-13 DIAGNOSIS — N9089 Other specified noninflammatory disorders of vulva and perineum: Secondary | ICD-10-CM

## 2017-10-13 MED ORDER — LIDOCAINE HCL 2 % EX GEL: 1.0000 "application " | CUTANEOUS | 2 refills | Status: AC | PRN

## 2017-10-13 NOTE — Patient Instructions (Signed)
Keep area clean and dry. Tylenol or ibuprofen for pain Return if area becomes red, swollen, painful

## 2017-10-13 NOTE — Progress Notes (Signed)
Patient Name: Natalie Pitts, female   DOB: [birth year], 47 y.o.  MRN: [id number]  Seen for skin tag removal on left labia. First noticed it a few weeks ago after wax. Rubs against clothing. Injected with small amount of lidocaine without epinephrine. Removed with scissors. Bleeding cauterized with silver nitrate. Advised to keep area clean and dry, bathing okay. Return if evidence of infection: swelling, pain, redness.  Truett Mainland, DO 10/13/2017 11:19 AM

## 2017-10-13 NOTE — Progress Notes (Signed)
Skin tag removal

## 2017-10-13 NOTE — Addendum Note (Signed)
Addended by: Truett Mainland on: 10/13/2017 11:50 AM   Modules accepted: Orders

## 2017-10-16 DIAGNOSIS — B09 Unspecified viral infection characterized by skin and mucous membrane lesions: Secondary | ICD-10-CM | POA: Diagnosis not present

## 2017-10-19 ENCOUNTER — Ambulatory Visit (INDEPENDENT_AMBULATORY_CARE_PROVIDER_SITE_OTHER): Payer: 59 | Admitting: Clinical

## 2017-10-19 DIAGNOSIS — F4323 Adjustment disorder with mixed anxiety and depressed mood: Secondary | ICD-10-CM | POA: Diagnosis not present

## 2017-10-27 DIAGNOSIS — B354 Tinea corporis: Secondary | ICD-10-CM | POA: Diagnosis not present

## 2017-11-07 ENCOUNTER — Ambulatory Visit (INDEPENDENT_AMBULATORY_CARE_PROVIDER_SITE_OTHER): Payer: 59 | Admitting: Clinical

## 2017-11-07 DIAGNOSIS — F4323 Adjustment disorder with mixed anxiety and depressed mood: Secondary | ICD-10-CM | POA: Diagnosis not present

## 2017-11-11 IMAGING — DX DG SACRUM/COCCYX 2+V
3 series · 3 of 3 positions shown · non-contrast
Comparison: None.

CLINICAL DATA: Low back pain for 2 months, no injury

EXAM:
SACRUM AND COCCYX - 2+ VIEW

[coccyx ap]
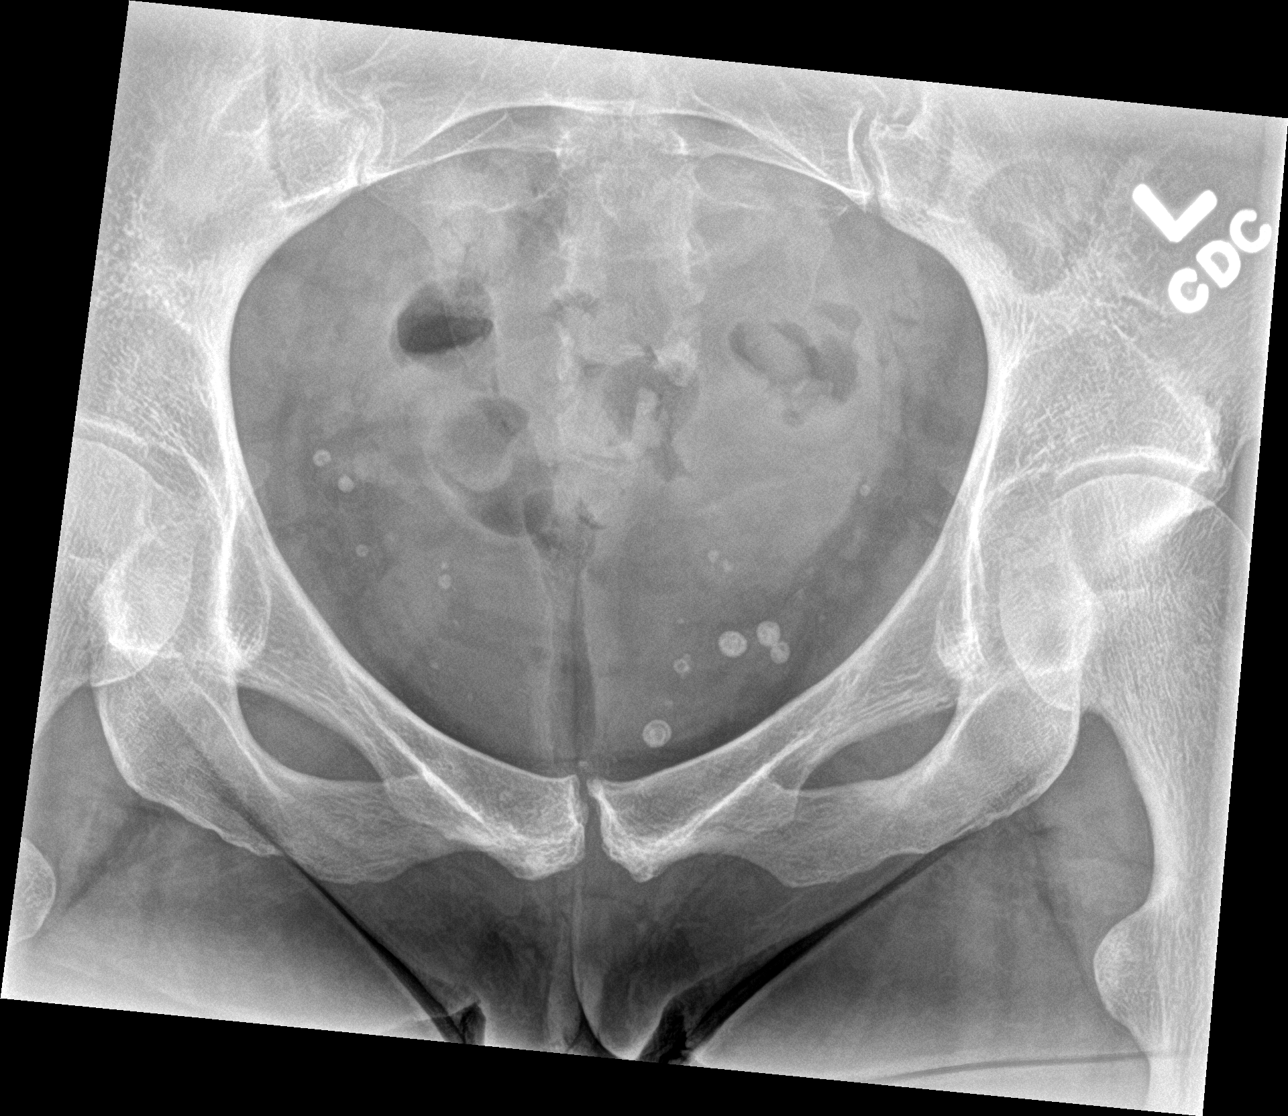

[sacrum ap]
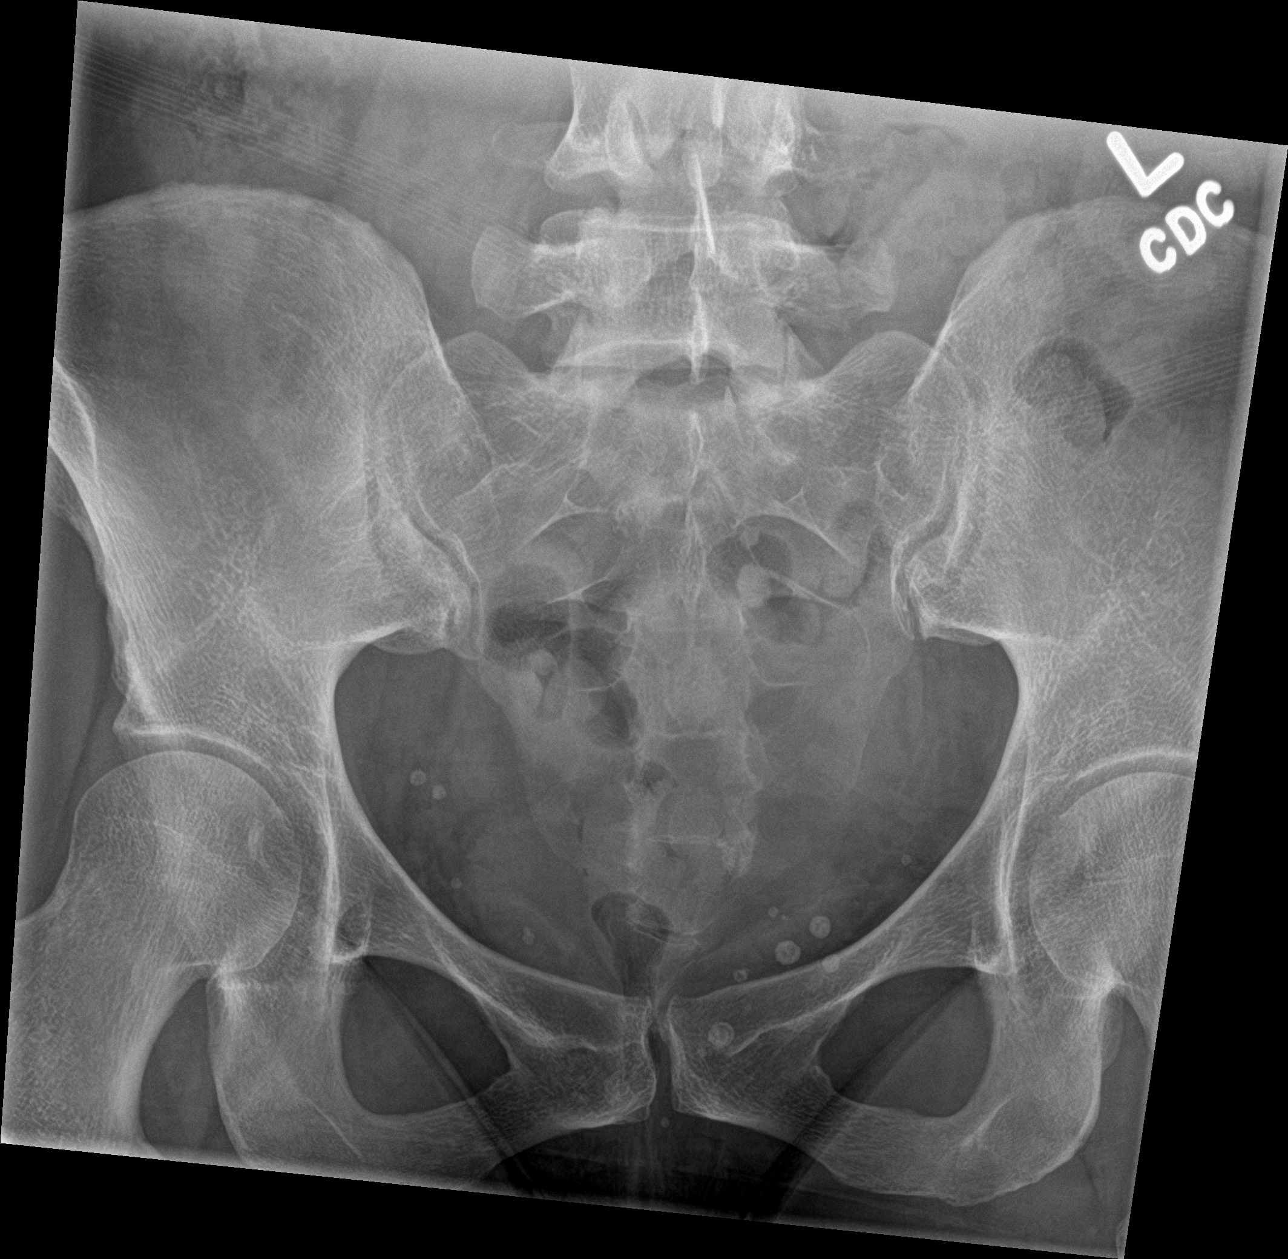

[sacrum lat]
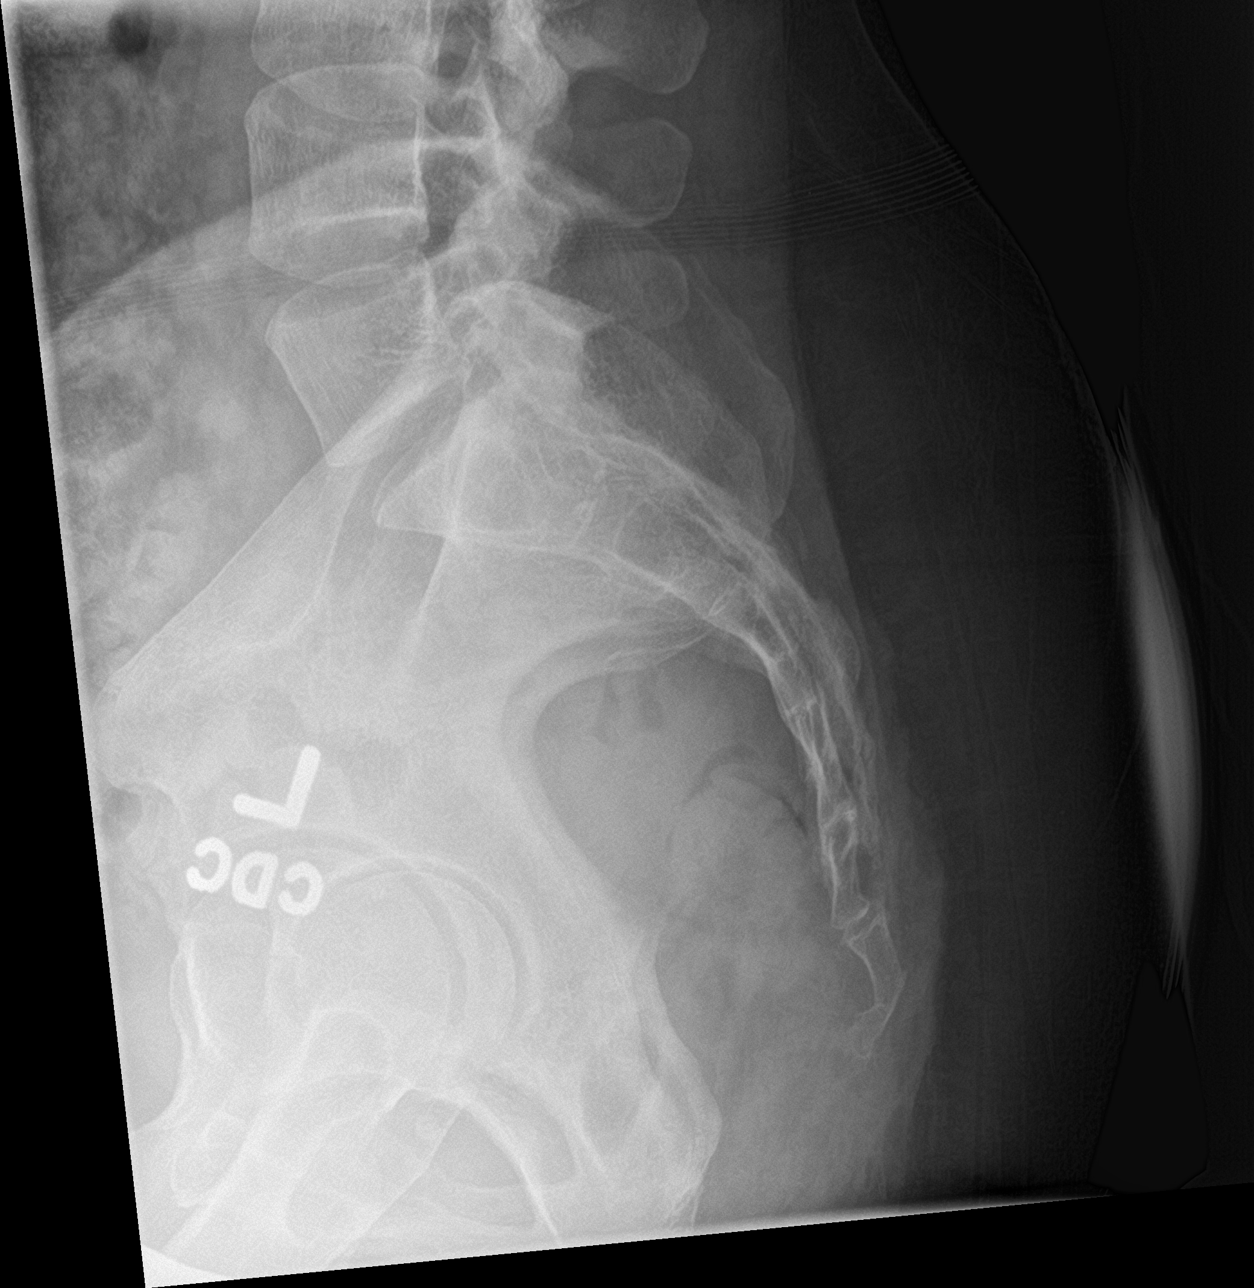

[3 of 3 positions shown; findings below may reference images not displayed]

FINDINGS: Both femoral heads appear to be in normal position. The pelvic rami
are intact. The SI joints are corticated. The sacral foramina appear
normal. No acute abnormality is seen. The sacrococcygeal elements
are in normal alignment..
IMPRESSION: Negative.

## 2017-11-20 ENCOUNTER — Ambulatory Visit (INDEPENDENT_AMBULATORY_CARE_PROVIDER_SITE_OTHER): Payer: 59 | Admitting: Clinical

## 2017-11-20 DIAGNOSIS — F4323 Adjustment disorder with mixed anxiety and depressed mood: Secondary | ICD-10-CM

## 2017-11-28 ENCOUNTER — Ambulatory Visit: Payer: 59 | Admitting: Clinical

## 2017-11-29 DIAGNOSIS — G8918 Other acute postprocedural pain: Secondary | ICD-10-CM | POA: Diagnosis not present

## 2017-11-29 DIAGNOSIS — Z9889 Other specified postprocedural states: Secondary | ICD-10-CM | POA: Diagnosis not present

## 2017-11-29 DIAGNOSIS — K1379 Other lesions of oral mucosa: Secondary | ICD-10-CM | POA: Diagnosis not present

## 2017-12-13 ENCOUNTER — Ambulatory Visit: Payer: 59 | Admitting: Clinical

## 2018-03-07 ENCOUNTER — Encounter: Payer: 59 | Admitting: Family Medicine

## 2018-03-13 DIAGNOSIS — R42 Dizziness and giddiness: Secondary | ICD-10-CM | POA: Diagnosis not present

## 2018-03-14 DIAGNOSIS — Z0000 Encounter for general adult medical examination without abnormal findings: Secondary | ICD-10-CM | POA: Diagnosis not present

## 2018-03-14 DIAGNOSIS — E559 Vitamin D deficiency, unspecified: Secondary | ICD-10-CM | POA: Diagnosis not present

## 2018-03-14 DIAGNOSIS — R5383 Other fatigue: Secondary | ICD-10-CM | POA: Diagnosis not present

## 2018-03-19 DIAGNOSIS — Q248 Other specified congenital malformations of heart: Secondary | ICD-10-CM | POA: Diagnosis not present

## 2018-03-19 DIAGNOSIS — I071 Rheumatic tricuspid insufficiency: Secondary | ICD-10-CM | POA: Diagnosis not present

## 2018-06-01 DIAGNOSIS — R109 Unspecified abdominal pain: Secondary | ICD-10-CM | POA: Diagnosis not present

## 2018-06-01 DIAGNOSIS — K210 Gastro-esophageal reflux disease with esophagitis: Secondary | ICD-10-CM | POA: Diagnosis not present

## 2018-06-18 ENCOUNTER — Other Ambulatory Visit: Payer: Self-pay

## 2018-06-18 DIAGNOSIS — Z3041 Encounter for surveillance of contraceptive pills: Secondary | ICD-10-CM

## 2018-06-18 MED ORDER — NORETHIN ACE-ETH ESTRAD-FE 1-20 MG-MCG(24) PO TABS: 1.0000 | ORAL_TABLET | Freq: Every day | ORAL | 9 refills | Status: AC

## 2018-06-18 MED ORDER — NORETHIN ACE-ETH ESTRAD-FE 1-20 MG-MCG(24) PO TABS: 1.0000 | ORAL_TABLET | Freq: Every day | ORAL | 11 refills | Status: DC

## 2018-06-22 DIAGNOSIS — Z1231 Encounter for screening mammogram for malignant neoplasm of breast: Secondary | ICD-10-CM | POA: Diagnosis not present

## 2022-10-29 ENCOUNTER — Emergency Department (HOSPITAL_BASED_OUTPATIENT_CLINIC_OR_DEPARTMENT_OTHER)
Admission: EM | Admit: 2022-10-29 | Discharge: 2022-10-29 | Disposition: A | Discharge status: Home / Self Care | Payer: 59 | Attending: Emergency Medicine | Admitting: Emergency Medicine

## 2022-10-29 ENCOUNTER — Other Ambulatory Visit: Payer: Self-pay

## 2022-10-29 ENCOUNTER — Encounter (HOSPITAL_BASED_OUTPATIENT_CLINIC_OR_DEPARTMENT_OTHER): Payer: Self-pay

## 2022-10-29 DIAGNOSIS — R350 Frequency of micturition: Secondary | ICD-10-CM | POA: Diagnosis present

## 2022-10-29 LAB — URINALYSIS, ROUTINE W REFLEX MICROSCOPIC
Bacteria, UA: NONE SEEN
Bilirubin Urine: NEGATIVE
Glucose, UA: NEGATIVE mg/dL
Ketones, ur: NEGATIVE mg/dL
Leukocytes,Ua: NEGATIVE
Nitrite: NEGATIVE
Protein, ur: NEGATIVE mg/dL
Specific Gravity, Urine: 1.015 (ref 1.005–1.030)
pH: 6.5 (ref 5.0–8.0)

## 2022-10-29 MED ORDER — FOSFOMYCIN TROMETHAMINE 3 G PO PACK: 3.0000 g | PACK | Freq: Once | ORAL | Status: DC

## 2022-10-29 MED ORDER — NITROFURANTOIN MONOHYD MACRO 100 MG PO CAPS: 100.0000 mg | ORAL_CAPSULE | Freq: Two times a day (BID) | ORAL | 0 refills | Status: AC

## 2022-10-29 NOTE — ED Triage Notes (Signed)
Pt to er, pt states that she is here for some urinary urgency and some chill, and also some pain during and after urination.  States that she also has some sporadic pain.  Pt states that the pain started on Thursday.

## 2022-10-29 NOTE — ED Provider Notes (Signed)
[facility] Provider Note   CSN: [id number] Arrival date & time: 10/29/22  1629     History  Chief Complaint  Patient presents with   Urinary Frequency    Natalie Pitts is a 52 y.o. female.  Patient here with urinary frequency.  Denies any discharge.  She is on her menstrual cycle.  Denies any abdominal pain or flank pain.  No fever or chills.  No nausea vomit diarrhea.  The history is provided by the patient.       Home Medications Prior to Admission medications   Medication Sig Start Date End Date Taking? Authorizing Provider  nitrofurantoin, macrocrystal-monohydrate, (MACROBID) 100 MG capsule Take 1 capsule (100 mg total) by mouth 2 (two) times daily for 3 days. 10/29/22 11/01/22 Yes Cabela Pacifico, DO  acetaminophen (TYLENOL) 500 MG tablet Take 500 mg by mouth every 6 (six) hours as needed.    [provider]  lidocaine (XYLOCAINE) 2 % jelly Apply 1 application topically as needed. 10/13/17   Truett Mainland, DO  Norethindrone Acetate-Ethinyl Estrad-FE (LOESTRIN 24 FE) 1-20 MG-MCG(24) tablet Take 1 tablet by mouth daily. 06/18/18   Lavonia Drafts, MD      Allergies    Penicillins, Celexa [citalopram hydrobromide], Doxycycline hyclate, and Prilosec [omeprazole]    Review of Systems   Review of Systems  Physical Exam Updated Vital Signs BP (!) 167/87 (BP Location: Right Arm)   Pulse (!) 112   Temp 97.6 F (36.4 C) (Oral)   Resp 18   Ht 5\' 6"  (1.676 m)   Wt 78.5 kg   SpO2 99%   BMI 27.92 kg/m  Physical Exam Vitals and nursing note reviewed.  Constitutional:      General: She is not in acute distress.    Appearance: She is well-developed. She is not ill-appearing.  HENT:     Head: Normocephalic and atraumatic.     Nose: Nose normal.     Mouth/Throat:     Mouth: Mucous membranes are moist.  Eyes:     Extraocular Movements: Extraocular movements intact.     Conjunctiva/sclera: Conjunctivae  normal.     Pupils: Pupils are equal, round, and reactive to light.  Cardiovascular:     Rate and Rhythm: Normal rate and regular rhythm.     Pulses: Normal pulses.     Heart sounds: Normal heart sounds. No murmur heard. Pulmonary:     Effort: Pulmonary effort is normal. No respiratory distress.     Breath sounds: Normal breath sounds.  Abdominal:     Palpations: Abdomen is soft.     Tenderness: There is no abdominal tenderness.  Musculoskeletal:        General: No swelling.     Cervical back: Normal range of motion and neck supple.  Skin:    General: Skin is warm and dry.     Capillary Refill: Capillary refill takes less than 2 seconds.  Neurological:     Mental Status: She is alert.  Psychiatric:        Mood and Affect: Mood normal.     ED Results / Procedures / Treatments   Labs (all labs ordered are listed, but only abnormal results are displayed) Labs Reviewed  URINALYSIS, ROUTINE W REFLEX MICROSCOPIC - Abnormal; Notable for the following components:      Result Value   Hgb urine dipstick MODERATE (*)    All other components within normal limits    EKG None  Radiology No results  found.  Procedures Procedures    Medications Ordered in ED Medications  fosfomycin (MONUROL) packet 3 g (has no administration in time range)    ED Course/ Medical Decision Making/ A&P                             Medical Decision Making Amount and/or Complexity of Data Reviewed Labs: ordered.  Risk Prescription drug management.   Natalie Pitts is here with UTI symptoms.  Urinalysis does not appear consistent with UTI however she does have some symptoms she is on her menstrual cycle.  Will treat with nitrofurantoin.  She has no abdominal pain.  Very well-appearing.  Recommend that she follow-up with her primary care doctor if needed.  Discharged in good condition.  This chart was dictated using voice recognition software.  Despite best efforts to proofread,  errors can  occur which can change the documentation meaning.         Final Clinical Impression(s) / ED Diagnoses Final diagnoses:  Urinary frequency    Rx / DC Orders ED Discharge Orders          Ordered    nitrofurantoin, macrocrystal-monohydrate, (MACROBID) 100 MG capsule  2 times daily        10/29/22 1706              Lennice Sites, DO 10/29/22 1712
